# Patient Record
Sex: Female | Born: 1955
Health system: Southern US, Community
[De-identification: ages and names within clinical notes are randomized; demographics above are authoritative.]

## PROBLEM LIST (undated history)

## (undated) DIAGNOSIS — R079 Chest pain, unspecified: Secondary | ICD-10-CM

## (undated) DIAGNOSIS — F329 Major depressive disorder, single episode, unspecified: Secondary | ICD-10-CM

## (undated) DIAGNOSIS — G4700 Insomnia, unspecified: Secondary | ICD-10-CM

## (undated) DIAGNOSIS — G930 Cerebral cysts: Secondary | ICD-10-CM

## (undated) DIAGNOSIS — R9431 Abnormal electrocardiogram [ECG] [EKG]: Secondary | ICD-10-CM

## (undated) DIAGNOSIS — E785 Hyperlipidemia, unspecified: Secondary | ICD-10-CM

## (undated) DIAGNOSIS — F32A Depression, unspecified: Secondary | ICD-10-CM

## (undated) HISTORY — DX: Abnormal electrocardiogram (ECG) (EKG): R94.31

## (undated) HISTORY — DX: Major depressive disorder, single episode, unspecified: F32.9

## (undated) HISTORY — PX: COLONOSCOPY: SHX174

## (undated) HISTORY — PX: CHOLECYSTECTOMY: SHX55

## (undated) HISTORY — DX: Hyperlipidemia, unspecified: E78.5

## (undated) HISTORY — PX: BRAIN SURGERY: SHX531

## (undated) HISTORY — DX: Depression, unspecified: F32.A

## (undated) HISTORY — DX: Chest pain, unspecified: R07.9

## (undated) HISTORY — DX: Insomnia, unspecified: G47.00

## (undated) HISTORY — DX: Cerebral cysts: G93.0

---

## 1998-10-15 ENCOUNTER — Other Ambulatory Visit: Admission: RE | Admit: 1998-10-15 | Discharge: 1998-10-15 | Payer: Self-pay | Admitting: Obstetrics and Gynecology

## 2000-01-27 ENCOUNTER — Encounter: Payer: Self-pay | Admitting: Neurological Surgery

## 2000-01-27 ENCOUNTER — Inpatient Hospital Stay (HOSPITAL_COMMUNITY): Admission: AD | Admit: 2000-01-27 | Discharge: 2000-02-04 | Payer: Self-pay | Admitting: Neurological Surgery

## 2000-01-31 ENCOUNTER — Encounter: Payer: Self-pay | Admitting: Neurological Surgery

## 2000-02-03 ENCOUNTER — Encounter: Payer: Self-pay | Admitting: Neurological Surgery

## 2000-04-05 ENCOUNTER — Encounter: Payer: Self-pay | Admitting: Neurological Surgery

## 2000-04-05 ENCOUNTER — Ambulatory Visit: Admission: RE | Admit: 2000-04-05 | Discharge: 2000-04-05 | Payer: Self-pay | Admitting: Neurological Surgery

## 2001-01-07 ENCOUNTER — Ambulatory Visit (HOSPITAL_COMMUNITY): Admission: RE | Admit: 2001-01-07 | Discharge: 2001-01-07 | Payer: Self-pay | Admitting: Neurological Surgery

## 2001-01-07 ENCOUNTER — Encounter: Payer: Self-pay | Admitting: Neurological Surgery

## 2001-04-14 ENCOUNTER — Other Ambulatory Visit: Admission: RE | Admit: 2001-04-14 | Discharge: 2001-04-14 | Payer: Self-pay | Admitting: Obstetrics and Gynecology

## 2002-04-27 ENCOUNTER — Other Ambulatory Visit: Admission: RE | Admit: 2002-04-27 | Discharge: 2002-04-27 | Payer: Self-pay | Admitting: Obstetrics and Gynecology

## 2003-05-01 ENCOUNTER — Other Ambulatory Visit: Admission: RE | Admit: 2003-05-01 | Discharge: 2003-05-01 | Payer: Self-pay | Admitting: Obstetrics and Gynecology

## 2004-05-08 ENCOUNTER — Other Ambulatory Visit: Admission: RE | Admit: 2004-05-08 | Discharge: 2004-05-08 | Payer: Self-pay | Admitting: Obstetrics and Gynecology

## 2005-05-20 ENCOUNTER — Other Ambulatory Visit: Admission: RE | Admit: 2005-05-20 | Discharge: 2005-05-20 | Payer: Self-pay | Admitting: Obstetrics and Gynecology

## 2006-07-06 ENCOUNTER — Ambulatory Visit: Payer: Self-pay | Admitting: Internal Medicine

## 2006-07-16 ENCOUNTER — Ambulatory Visit: Payer: Self-pay | Admitting: Internal Medicine

## 2011-10-20 ENCOUNTER — Other Ambulatory Visit: Payer: Self-pay | Admitting: Obstetrics and Gynecology

## 2013-03-28 ENCOUNTER — Other Ambulatory Visit: Payer: Self-pay | Admitting: Family Medicine

## 2013-03-28 ENCOUNTER — Ambulatory Visit
Admission: RE | Admit: 2013-03-28 | Discharge: 2013-03-28 | Disposition: A | Discharge status: Home / Self Care | Payer: BC Managed Care – PPO | Source: Ambulatory Visit | Attending: Family Medicine | Admitting: Family Medicine

## 2013-03-28 DIAGNOSIS — R0781 Pleurodynia: Secondary | ICD-10-CM

## 2013-11-09 ENCOUNTER — Other Ambulatory Visit: Payer: Self-pay | Admitting: Obstetrics and Gynecology

## 2014-11-16 ENCOUNTER — Other Ambulatory Visit: Payer: Self-pay | Admitting: Obstetrics and Gynecology

## 2014-11-19 LAB — CYTOLOGY - PAP

## 2016-03-09 ENCOUNTER — Encounter: Payer: Self-pay | Admitting: Internal Medicine

## 2016-06-29 ENCOUNTER — Encounter: Payer: Self-pay | Admitting: Gastroenterology

## 2016-07-22 DIAGNOSIS — F331 Major depressive disorder, recurrent, moderate: Secondary | ICD-10-CM | POA: Diagnosis not present

## 2016-07-22 DIAGNOSIS — F5104 Psychophysiologic insomnia: Secondary | ICD-10-CM | POA: Diagnosis not present

## 2016-10-19 ENCOUNTER — Encounter: Payer: Self-pay | Admitting: Gastroenterology

## 2016-10-21 DIAGNOSIS — Z803 Family history of malignant neoplasm of breast: Secondary | ICD-10-CM | POA: Diagnosis not present

## 2016-10-21 DIAGNOSIS — Z1231 Encounter for screening mammogram for malignant neoplasm of breast: Secondary | ICD-10-CM | POA: Diagnosis not present

## 2016-10-29 DIAGNOSIS — N6489 Other specified disorders of breast: Secondary | ICD-10-CM | POA: Diagnosis not present

## 2016-10-29 DIAGNOSIS — N6002 Solitary cyst of left breast: Secondary | ICD-10-CM | POA: Diagnosis not present

## 2016-11-04 ENCOUNTER — Other Ambulatory Visit: Payer: Self-pay | Admitting: Radiology

## 2016-11-04 DIAGNOSIS — D242 Benign neoplasm of left breast: Secondary | ICD-10-CM | POA: Diagnosis not present

## 2016-11-04 DIAGNOSIS — N6320 Unspecified lump in the left breast, unspecified quadrant: Secondary | ICD-10-CM | POA: Diagnosis not present

## 2016-11-17 ENCOUNTER — Ambulatory Visit (AMBULATORY_SURGERY_CENTER): Payer: Self-pay | Admitting: *Deleted

## 2016-11-17 VITALS — Ht 65.0 in | Wt 166.0 lb

## 2016-11-17 DIAGNOSIS — Z1211 Encounter for screening for malignant neoplasm of colon: Secondary | ICD-10-CM

## 2016-11-17 MED ORDER — NA SULFATE-K SULFATE-MG SULF 17.5-3.13-1.6 GM/177ML PO SOLN: 1.0000 | Freq: Once | ORAL | 0 refills | Status: AC

## 2016-11-17 NOTE — Progress Notes (Signed)
No egg or soy allergy. No anesthesia problems.  No home O2.  No diet meds.  

## 2016-12-01 ENCOUNTER — Ambulatory Visit (AMBULATORY_SURGERY_CENTER): Payer: BLUE CROSS/BLUE SHIELD | Admitting: Gastroenterology

## 2016-12-01 ENCOUNTER — Encounter: Payer: Self-pay | Admitting: Gastroenterology

## 2016-12-01 VITALS — BP 111/77 | HR 59 | Temp 97.5°F | Resp 12 | Ht 65.0 in | Wt 166.0 lb

## 2016-12-01 DIAGNOSIS — D122 Benign neoplasm of ascending colon: Secondary | ICD-10-CM | POA: Diagnosis not present

## 2016-12-01 DIAGNOSIS — K635 Polyp of colon: Secondary | ICD-10-CM | POA: Diagnosis not present

## 2016-12-01 DIAGNOSIS — Z1211 Encounter for screening for malignant neoplasm of colon: Secondary | ICD-10-CM

## 2016-12-01 DIAGNOSIS — Z1212 Encounter for screening for malignant neoplasm of rectum: Secondary | ICD-10-CM

## 2016-12-01 MED ORDER — SODIUM CHLORIDE 0.9 % IV SOLN: 500.0000 mL | INTRAVENOUS | Status: DC

## 2016-12-01 NOTE — Patient Instructions (Signed)
YOU HAD AN ENDOSCOPIC PROCEDURE TODAY AT THE [facility]:   Refer to the procedure report that was given to you for any specific questions about what was found during the examination.  If the procedure report does not answer your questions, please call your gastroenterologist to clarify.  If you requested that your care partner not be given the details of your procedure findings, then the procedure report has been included in a sealed envelope for you to review at your convenience later.  YOU SHOULD EXPECT: Some feelings of bloating in the abdomen. Passage of more gas than usual.  Walking can help get rid of the air that was put into your GI tract during the procedure and reduce the bloating. If you had a lower endoscopy (such as a colonoscopy or flexible sigmoidoscopy) you may notice spotting of blood in your stool or on the toilet paper. If you underwent a bowel prep for your procedure, you may not have a normal bowel movement for a few days.  Please Note:  You might notice some irritation and congestion in your nose or some drainage.  This is from the oxygen used during your procedure.  There is no need for concern and it should clear up in a day or so.  SYMPTOMS TO REPORT IMMEDIATELY:   Following lower endoscopy (colonoscopy or flexible sigmoidoscopy):  Excessive amounts of blood in the stool  Significant tenderness or worsening of abdominal pains  Swelling of the abdomen that is new, acute  Fever of 100F or higher   For urgent or emergent issues, a gastroenterologist can be reached at any hour by calling [phone].   DIET:  We do recommend a small meal at first, but then you may proceed to your regular diet.  Drink plenty of fluids but you should avoid alcoholic beverages for 24 hours.  ACTIVITY:  You should plan to take it easy for the rest of today and you should NOT DRIVE or use heavy machinery until tomorrow (because of the sedation medicines used during the test).     FOLLOW UP: Our staff will call the number listed on your records the next business day following your procedure to check on you and address any questions or concerns that you may have regarding the information given to you following your procedure. If we do not reach you, we will leave a message.  However, if you are feeling well and you are not experiencing any problems, there is no need to return our call.  We will assume that you have returned to your regular daily activities without incident.  If any biopsies were taken you will be contacted by phone or by letter within the next 1-3 weeks.  Please call us at [phone] if you have not heard about the biopsies in 3 weeks.    SIGNATURES/CONFIDENTIALITY: You and/or your care partner have signed paperwork which will be entered into your electronic medical record.  These signatures attest to the fact that that the information above on your After Visit Summary has been reviewed and is understood.  Full responsibility of the confidentiality of this discharge information lies with you and/or your care-partner.   No ibuprofen,naproxen,or other non-steroidal anti-inflammatory drugs for 2 weeks,resume remainder of medications. Information given on polyps. 

## 2016-12-01 NOTE — Op Note (Signed)
[facility] Patient Name: [name] Procedure Date: 12/01/2016 10:38 AM MRN: [id number] Endoscopist: Remo Lipps P. Armbruster MD, MD Age: 61 Referring MD:  Date of Birth: 09/11/1956 Gender: Female Account #: [id number] Procedure:                Colonoscopy Indications:              Screening for colorectal malignant neoplasm Medicines:                Monitored Anesthesia Care Procedure:                Pre-Anesthesia Assessment:                           - Prior to the procedure, a History and Physical                            was performed, and patient medications and                            allergies were reviewed. The patient's tolerance of                            previous anesthesia was also reviewed. The risks                            and benefits of the procedure and the sedation                            options and risks were discussed with the patient.                            All questions were answered, and informed consent                            was obtained. Prior Anticoagulants: The patient has                            taken no previous anticoagulant or antiplatelet                            agents. ASA Grade Assessment: II - A patient with                            mild systemic disease. After reviewing the risks                            and benefits, the patient was deemed in                            satisfactory condition to undergo the procedure.                           After obtaining informed consent, the colonoscope  was passed under direct vision. Throughout the                            procedure, the patient's blood pressure, pulse, and                            oxygen saturations were monitored continuously. The                            Model CF-HQ190L [id number]) scope was introduced                            through the anus and advanced to the the cecum,                            identified  by appendiceal orifice and ileocecal                            valve. The colonoscopy was performed without                            difficulty. The patient tolerated the procedure                            well. The quality of the bowel preparation was                            good. The ileocecal valve, appendiceal orifice, and                            rectum were photographed. Scope In: 10:48:52 AM Scope Out: 11:10:32 AM Scope Withdrawal Time: 0 hours 18 minutes 7 seconds  Total Procedure Duration: 0 hours 21 minutes 40 seconds  Findings:                 The perianal and digital rectal examinations were                            normal.                           A 5 mm polyp was found in the ascending colon. The                            polyp was sessile. The polyp was removed with a                            cold snare. Resection and retrieval were complete.                           The colon was quite spastic in the right colon                            which prolonged the procedure. The exam was  otherwise without abnormality on direct and                            retroflexion views. Complications:            No immediate complications. Estimated blood loss:                            Minimal. Estimated Blood Loss:     Estimated blood loss was minimal. Impression:               - One 5 mm polyp in the ascending colon, removed                            with a cold snare. Resected and retrieved.                           - Spastic colon                           - The examination was otherwise normal on direct                            and retroflexion views. Recommendation:           - Patient has a contact number available for                            emergencies. The signs and symptoms of potential                            delayed complications were discussed with the                            patient. Return to normal activities  tomorrow.                            Written discharge instructions were provided to the                            patient.                           - Resume previous diet.                           - Continue present medications.                           - Await pathology results.                           - Repeat colonoscopy is recommended for                            surveillance. The colonoscopy date will be  determined after pathology results from today's                            exam become available for review.                           - No ibuprofen, naproxen, or other non-steroidal                            anti-inflammatory drugs for 2 weeks after polyp                            removal. Remo Lipps P. Armbruster MD, MD 12/01/2016 11:13:29 AM This report has been signed electronically.

## 2016-12-01 NOTE — Progress Notes (Signed)
  [facility] Post-op Note  Patient: [name]  Procedure(s) Performed: colonoscopy  Patient Location: [facility] - Recovery Area  Anesthesia Type: Deep Sedation/Propofol  Level of Consciousness: awake, oriented and patient cooperative  Airway and Oxygen Therapy: Patient Spontanous Breathing  Post-op Pain: none  Post-op Assessment:  Post-op Vital signs reviewed, Patient's Cardiovascular Status Stable, Respiratory Function Stable, Patent Airway, No signs of Nausea or vomiting and Pain level controlled  Post-op Vital Signs: Reviewed and stable  Complications: No apparent anesthesia complications  [name] E 11:17 AM

## 2016-12-01 NOTE — Progress Notes (Signed)
Called to room to assist during endoscopic procedure.  Patient ID and intended procedure confirmed with present staff. Received instructions for my participation in the procedure from the performing physician.  

## 2016-12-02 ENCOUNTER — Telehealth: Payer: Self-pay | Admitting: *Deleted

## 2016-12-02 ENCOUNTER — Telehealth: Payer: Self-pay

## 2016-12-02 NOTE — Telephone Encounter (Signed)
No answer or voicemail available will attempt to call back this afternoon. Sm

## 2016-12-02 NOTE — Telephone Encounter (Signed)
  Follow up Call-  Call back number 12/01/2016  Post procedure Call Back phone  # [phone]  Permission to leave phone message Yes  Some recent data might be hidden     Patient was called for follow up after his procedure on 12/01/2016. No answer at the number given for follow up phone call. I was not able to leave a message.

## 2016-12-07 ENCOUNTER — Encounter: Payer: Self-pay | Admitting: Gastroenterology

## 2016-12-21 ENCOUNTER — Other Ambulatory Visit (HOSPITAL_COMMUNITY)
Admission: RE | Admit: 2016-12-21 | Discharge: 2016-12-21 | Disposition: A | Discharge status: Home / Self Care | Payer: BLUE CROSS/BLUE SHIELD | Source: Ambulatory Visit | Attending: Family Medicine | Admitting: Family Medicine

## 2016-12-21 ENCOUNTER — Other Ambulatory Visit: Payer: Self-pay | Admitting: Family Medicine

## 2016-12-21 DIAGNOSIS — Z01411 Encounter for gynecological examination (general) (routine) with abnormal findings: Secondary | ICD-10-CM | POA: Diagnosis not present

## 2016-12-21 DIAGNOSIS — Z13220 Encounter for screening for lipoid disorders: Secondary | ICD-10-CM | POA: Diagnosis not present

## 2016-12-21 DIAGNOSIS — Z1151 Encounter for screening for human papillomavirus (HPV): Secondary | ICD-10-CM | POA: Insufficient documentation

## 2016-12-21 DIAGNOSIS — Z01419 Encounter for gynecological examination (general) (routine) without abnormal findings: Secondary | ICD-10-CM | POA: Diagnosis not present

## 2016-12-21 DIAGNOSIS — Z0000 Encounter for general adult medical examination without abnormal findings: Secondary | ICD-10-CM | POA: Diagnosis not present

## 2016-12-21 DIAGNOSIS — Z5181 Encounter for therapeutic drug level monitoring: Secondary | ICD-10-CM | POA: Diagnosis not present

## 2016-12-24 LAB — CYTOLOGY - PAP
Diagnosis: NEGATIVE
HPV: NOT DETECTED

## 2017-01-22 ENCOUNTER — Ambulatory Visit (INDEPENDENT_AMBULATORY_CARE_PROVIDER_SITE_OTHER): Payer: Self-pay | Admitting: Orthopedic Surgery

## 2018-09-19 DIAGNOSIS — Z13220 Encounter for screening for lipoid disorders: Secondary | ICD-10-CM | POA: Diagnosis not present

## 2018-09-19 DIAGNOSIS — Z23 Encounter for immunization: Secondary | ICD-10-CM | POA: Diagnosis not present

## 2018-09-19 DIAGNOSIS — Z5181 Encounter for therapeutic drug level monitoring: Secondary | ICD-10-CM | POA: Diagnosis not present

## 2018-09-19 DIAGNOSIS — Z0000 Encounter for general adult medical examination without abnormal findings: Secondary | ICD-10-CM | POA: Diagnosis not present

## 2018-10-24 DIAGNOSIS — F331 Major depressive disorder, recurrent, moderate: Secondary | ICD-10-CM | POA: Diagnosis not present

## 2020-09-14 ENCOUNTER — Ambulatory Visit: Payer: BLUE CROSS/BLUE SHIELD | Attending: Internal Medicine

## 2020-09-14 DIAGNOSIS — Z23 Encounter for immunization: Secondary | ICD-10-CM

## 2020-09-14 NOTE — Progress Notes (Signed)
   Covid-19 Vaccination Clinic  Name:  SHANDORA KOOGLER    MRN: [id number] DOB: 29-Jun-1956  09/14/2020  Ms. Deardorff was observed post Covid-19 immunization for 15 minutes without incident. She was provided with Vaccine Information Sheet and instruction to access the V-Safe system.   Ms. Quest was instructed to call 911 with any severe reactions post vaccine: Marland Kitchen Difficulty breathing  . Swelling of face and throat  . A fast heartbeat  . A bad rash all over body  . Dizziness and weakness

## 2020-10-29 ENCOUNTER — Other Ambulatory Visit: Payer: Self-pay | Admitting: Family Medicine

## 2020-10-29 DIAGNOSIS — Z1231 Encounter for screening mammogram for malignant neoplasm of breast: Secondary | ICD-10-CM

## 2020-11-08 ENCOUNTER — Encounter: Payer: Self-pay | Admitting: General Practice

## 2020-12-03 ENCOUNTER — Other Ambulatory Visit: Payer: Self-pay

## 2020-12-03 ENCOUNTER — Encounter: Payer: Self-pay | Admitting: Internal Medicine

## 2020-12-03 ENCOUNTER — Ambulatory Visit (INDEPENDENT_AMBULATORY_CARE_PROVIDER_SITE_OTHER): Payer: Medicare Other | Admitting: Internal Medicine

## 2020-12-03 VITALS — BP 114/70 | HR 84 | Ht 65.0 in | Wt 214.0 lb

## 2020-12-03 DIAGNOSIS — E782 Mixed hyperlipidemia: Secondary | ICD-10-CM | POA: Diagnosis not present

## 2020-12-03 DIAGNOSIS — R079 Chest pain, unspecified: Secondary | ICD-10-CM

## 2020-12-03 NOTE — Progress Notes (Signed)
Cardiology Office Note:    Date:  12/03/2020   ID:  [name], DOB 1956/04/28, MRN [id number]  PCP:  Shaune Pollack, MD (Inactive)  [facility] Cardiologist:  No primary care provider on file.  [facility] Electrophysiologist:  None   CC: EKG changes Consulted for the evaluation of chest pain at the behest of Shaune Pollack, MD (Inactive)  History of Present Illness:    [name] is a 65 y.o. female with a hx of Morbid Obesity with HLD, with chest pain presenting for evaluation.  Patient notes that she is feeling tired a lot.  Notse that she has sharp, sternal pain.; this happened one month ago,  Discomfort occured with sitting in her recline, worsens spontaneous, and improves within one second.  Notes having brief nauseous spell working in her [employer] 2 weeks prior.  Patient exertion notable for walking (1.5 miles) and feels no symptoms.  No shortness of breath, DOE.  No PND or orthopnea.  No bendopnea, leg swelling , or abdominal swelling.  No syncope or near syncope.  Notes no palpitations or funny heart beats.     Patient reports NO prior cardiac testing including  echo,  stress test,  heart catheterizations.  No history of pre-eclampsia.  No Fen-Phen.  Past Medical History:  Diagnosis Date  . Abnormal EKG   . Brain cyst   . Chest pain   . Depression   . Insomnia     Past Surgical History:  Procedure Laterality Date  . BRAIN SURGERY     removed cyst  . CHOLECYSTECTOMY    . COLONOSCOPY      Current Medications: Current Meds  Medication Sig  . Lactobacillus Rhamnosus, GG, (CULTURELLE PO) Take by mouth.  . Multiple Vitamins-Minerals (MULTIVITAMIN ADULT PO) Take by mouth.  Marland Kitchen PARoxetine (PAXIL) 30 MG tablet Take 30 mg by mouth daily.  Marland Kitchen zolpidem (AMBIEN) 10 MG tablet Take 10 mg by mouth at bedtime as needed for sleep.   Current Facility-Administered Medications for the 12/03/20 encounter (Office Visit) with Christell Constant, MD  Medication   . 0.9 %  sodium chloride infusion     Allergies:   Suvorexant   Social History   Socioeconomic History  . Marital status: Married    Spouse name: Not on file  . Number of children: Not on file  . Years of education: Not on file  . Highest education level: Not on file  Occupational History  . Not on file  Tobacco Use  . Smoking status: Former Smoker    Types: Cigarettes    Quit date: 02/16/2004    Years since quitting: 16.8  . Smokeless tobacco: Never Used  Substance and Sexual Activity  . Alcohol use: No  . Drug use: No  . Sexual activity: Not on file  Other Topics Concern  . Not on file  Social History Narrative  . 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 There is no history of Colon cancer, Esophageal cancer, Rectal cancer, or Stomach cancer. History of coronary artery disease notable for father, possibly in brother. History of heart failure notable for father. History of arrhythmia notable for no members, but her brother has to take a blood thinner NOS.  ROS:   Please see the history of present illness.    All other systems reviewed and are negative.  EKGs/Labs/Other Studies Reviewed:    The following studies were reviewed today:  EKG:   1/4.22: SR rate 84 low voltage QRS with borderline evidence of anterior infarct pattern  Recent Labs: No results found for requested  labs within last 8760 hours.  Recent Lipid Panel No results found for: CHOL, TRIG, HDL, CHOLHDL, VLDL, LDLCALC, LDLDIRECT  [facility] Labs 10/23/20 Cholesterol 192 HDL 51 LDL 113 TGs 159  Risk Assessment/Calculations:     ASCVD Risk 6.6%  Physical Exam:    VS:  BP 114/70   Pulse 84   Ht 5\' 5"  (1.651 m)   Wt 214 lb (97.1 kg)   SpO2 96%   BMI 35.61 kg/m     Wt Readings from Last 3 Encounters:  12/03/20 214 lb (97.1 kg)  12/01/16 166 lb (75.3 kg)  11/17/16 166 lb (75.3 kg)    GEN: Obese. well developed in no acute distress HEENT: Normal NECK: No JVD; No carotid bruits LYMPHATICS: No lymphadenopathy CARDIAC: RRR, no murmurs, rubs, gallops RESPIRATORY:  Clear to auscultation without rales, wheezing or rhonchi  ABDOMEN: Soft, non-tender, non-distended MUSCULOSKELETAL:  No edema; No deformity  SKIN: Warm and dry NEUROLOGIC:  Alert and oriented x 3 PSYCHIATRIC:  Normal affect   ASSESSMENT:    1. Chest pain of uncertain etiology   2. Mixed hyperlipidemia   3. Morbid obesity (HCC)    PLAN:    In order of problems listed above:  Chest Pain HLD Morbid Obesity - The patient presents with non-cardiac  - ASCVD risk estimated at 6.6 % - will get CAC score to risk stratify  - if new cardiac sx emerge, low threshold to pursue ischemic work up  3 months follow up unless new symptoms or abnormal test results warranting change in plan  Would be reasonable for  Virtual Follow up  Would be reasonable for  APP Follow up       Medication Adjustments/Labs and Tests Ordered: Current medicines are reviewed at length with the patient today.  Concerns regarding medicines are outlined above.  Orders Placed This Encounter  Procedures  . CT CARDIAC SCORING (SELF PAY ONLY)  . EKG 12-Lead   No orders of the defined types were placed in this encounter.   Patient Instructions  Medication Instructions:  Your physician recommends that you continue on your current medications as  directed. Please refer to the Current Medication list given to you today.  *If you need a refill on your cardiac medications before your next appointment, please call your pharmacy*  Lab Work: If you have labs (blood work) drawn today and your tests are completely normal, you will receive your results only by: Marland Kitchen MyChart Message (if you have MyChart) OR . A paper copy in the mail If you have any lab test that is abnormal or we need to change your treatment, we will call you to review the results.  Testing/Procedures: Cardiac CT scanning for calcium score, (CAT scanning), is a noninvasive, special x-ray that produces cross-sectional  images of the body using x-rays and a computer. CT scans help physicians diagnose and treat medical conditions. For some CT exams, a contrast material is used to enhance visibility in the area of the body being studied. CT scans provide greater clarity and reveal more details than regular x-ray exams.  Follow-Up: At [facility], you and your health needs are our priority.  As part of our continuing mission to provide you with exceptional heart care, we have created designated Provider Care Teams.  These Care Teams include your primary Cardiologist (physician) and Advanced Practice Providers (APPs -  Physician Assistants and Nurse Practitioners) who all work together to provide you with the care you need, when you need it.  We recommend signing up for the patient portal called "MyChart".  Sign up information is provided on this After Visit Summary.  MyChart is used to connect with patients for Virtual Visits (Telemedicine).  Patients are able to view lab/test results, encounter notes, upcoming appointments, etc.  Non-urgent messages can be sent to your provider as well.   To learn more about what you can do with MyChart, go to [email or web address].    Your next appointment:   3 month(s)  The format for your next appointment:   In Person  Provider:   You may  see Dr. Gasper Sells or one of the following Advanced Practice Providers on your designated Care Team:    Melina Copa, PA-C  Ermalinda Barrios, PA-C        Signed, Werner Lean, MD  12/03/2020 9:27 AM    [place]

## 2020-12-03 NOTE — Patient Instructions (Signed)
Medication Instructions:  Your physician recommends that you continue on your current medications as directed. Please refer to the Current Medication list given to you today.  *If you need a refill on your cardiac medications before your next appointment, please call your pharmacy*  Lab Work: If you have labs (blood work) drawn today and your tests are completely normal, you will receive your results only by: Marland Kitchen MyChart Message (if you have MyChart) OR . A paper copy in the mail If you have any lab test that is abnormal or we need to change your treatment, we will call you to review the results.  Testing/Procedures: Cardiac CT scanning for calcium score, (CAT scanning), is a noninvasive, special x-ray that produces cross-sectional images of the body using x-rays and a computer. CT scans help physicians diagnose and treat medical conditions. For some CT exams, a contrast material is used to enhance visibility in the area of the body being studied. CT scans provide greater clarity and reveal more details than regular x-ray exams.  Follow-Up: At [facility], you and your health needs are our priority.  As part of our continuing mission to provide you with exceptional heart care, we have created designated Provider Care Teams.  These Care Teams include your primary Cardiologist (physician) and Advanced Practice Providers (APPs -  Physician Assistants and Nurse Practitioners) who all work together to provide you with the care you need, when you need it.  We recommend signing up for the patient portal called "MyChart".  Sign up information is provided on this After Visit Summary.  MyChart is used to connect with patients for Virtual Visits (Telemedicine).  Patients are able to view lab/test results, encounter notes, upcoming appointments, etc.  Non-urgent messages can be sent to your provider as well.   To learn more about what you can do with MyChart, go to [email or web address].    Your next  appointment:   3 month(s)  The format for your next appointment:   In Person  Provider:   You may see Dr. Izora Ribas or one of the following Advanced Practice Providers on your designated Care Team:    Ronie Spies, PA-C  Jacolyn Reedy, PA-C

## 2020-12-10 ENCOUNTER — Ambulatory Visit (INDEPENDENT_AMBULATORY_CARE_PROVIDER_SITE_OTHER)
Admission: RE | Admit: 2020-12-10 | Discharge: 2020-12-10 | Disposition: A | Discharge status: Home / Self Care | Payer: Self-pay | Source: Ambulatory Visit | Attending: Internal Medicine | Admitting: Internal Medicine

## 2020-12-10 ENCOUNTER — Other Ambulatory Visit: Payer: Self-pay

## 2020-12-10 DIAGNOSIS — E782 Mixed hyperlipidemia: Secondary | ICD-10-CM

## 2020-12-10 DIAGNOSIS — R079 Chest pain, unspecified: Secondary | ICD-10-CM

## 2020-12-11 ENCOUNTER — Telehealth: Payer: Self-pay

## 2020-12-11 ENCOUNTER — Ambulatory Visit: Payer: BLUE CROSS/BLUE SHIELD

## 2020-12-11 DIAGNOSIS — R911 Solitary pulmonary nodule: Secondary | ICD-10-CM

## 2020-12-11 NOTE — Telephone Encounter (Signed)
Left patient a message regarding recent results, requested a call back to the office. Results sent to PCP.

## 2020-12-11 NOTE — Telephone Encounter (Signed)
The patient has been notified of the result and verbalized understanding.  All questions (if any) were answered.  Order for Echo placed.  Sent to Dr. Justin Mend, PCP  Wilma Flavin, RN 12/11/2020 2:42 PM

## 2020-12-11 NOTE — Telephone Encounter (Signed)
-----   Message from Werner Lean, MD sent at 12/10/2020  4:59 PM EST ----- Results: No evidence of coronary calcium Borderline dilation of the ascending aorta Incidental pulmonary nodule Plan: Confirm with echocardiogram Please send results to PCP who may either follow her pulmonary nodules or refer to solitary nodule clinic through [facility]  Werner Lean, MD

## 2020-12-11 NOTE — Telephone Encounter (Signed)
-----   Message from Mahesh A Chandrasekhar, MD sent at 12/10/2020  4:59 PM EST ----- Results: No evidence of coronary calcium Borderline dilation of the ascending aorta Incidental pulmonary nodule Plan: Confirm with echocardiogram Please send results to PCP who may either follow her pulmonary nodules or refer to solitary nodule clinic through [facility]  Mahesh A Chandrasekhar, MD  

## 2020-12-11 NOTE — Telephone Encounter (Signed)
Patient is returning call to review CT results.

## 2021-01-02 ENCOUNTER — Ambulatory Visit (HOSPITAL_COMMUNITY): Payer: Medicare Other | Attending: Cardiology

## 2021-01-02 ENCOUNTER — Other Ambulatory Visit: Payer: Self-pay

## 2021-01-02 DIAGNOSIS — E669 Obesity, unspecified: Secondary | ICD-10-CM | POA: Insufficient documentation

## 2021-01-02 DIAGNOSIS — G4700 Insomnia, unspecified: Secondary | ICD-10-CM | POA: Insufficient documentation

## 2021-01-02 DIAGNOSIS — R9431 Abnormal electrocardiogram [ECG] [EKG]: Secondary | ICD-10-CM | POA: Diagnosis not present

## 2021-01-02 DIAGNOSIS — R911 Solitary pulmonary nodule: Secondary | ICD-10-CM | POA: Diagnosis not present

## 2021-01-02 DIAGNOSIS — E785 Hyperlipidemia, unspecified: Secondary | ICD-10-CM | POA: Diagnosis not present

## 2021-01-02 DIAGNOSIS — R079 Chest pain, unspecified: Secondary | ICD-10-CM | POA: Diagnosis not present

## 2021-01-02 LAB — ECHOCARDIOGRAM COMPLETE
Area-P 1/2: 4.21 cm2
S' Lateral: 2.8 cm

## 2021-01-03 ENCOUNTER — Telehealth: Payer: Self-pay | Admitting: Internal Medicine

## 2021-01-03 NOTE — Telephone Encounter (Signed)
Patient returning call for echo results. 

## 2021-01-03 NOTE — Telephone Encounter (Signed)
Pt aware of echo results ./cy 

## 2021-01-22 ENCOUNTER — Other Ambulatory Visit: Payer: Self-pay

## 2021-01-22 ENCOUNTER — Ambulatory Visit
Admission: RE | Admit: 2021-01-22 | Discharge: 2021-01-22 | Disposition: A | Discharge status: Home / Self Care | Payer: Medicare Other | Source: Ambulatory Visit | Attending: Family Medicine | Admitting: Family Medicine

## 2021-01-22 DIAGNOSIS — Z1231 Encounter for screening mammogram for malignant neoplasm of breast: Secondary | ICD-10-CM | POA: Diagnosis not present

## 2021-03-06 ENCOUNTER — Other Ambulatory Visit: Payer: Self-pay

## 2021-03-06 ENCOUNTER — Ambulatory Visit: Payer: Medicare Other | Admitting: Internal Medicine

## 2021-03-06 ENCOUNTER — Encounter: Payer: Self-pay | Admitting: Internal Medicine

## 2021-03-06 DIAGNOSIS — Z87891 Personal history of nicotine dependence: Secondary | ICD-10-CM | POA: Diagnosis not present

## 2021-03-06 DIAGNOSIS — E782 Mixed hyperlipidemia: Secondary | ICD-10-CM | POA: Diagnosis not present

## 2021-03-06 DIAGNOSIS — R911 Solitary pulmonary nodule: Secondary | ICD-10-CM | POA: Diagnosis not present

## 2021-03-06 DIAGNOSIS — I700 Atherosclerosis of aorta: Secondary | ICD-10-CM | POA: Insufficient documentation

## 2021-03-06 NOTE — Patient Instructions (Signed)
Medication Instructions:  Your physician recommends that you continue on your current medications as directed. Please refer to the Current Medication list given to you today.  *If you need a refill on your cardiac medications before your next appointment, please call your pharmacy*   Lab Work: NONE If you have labs (blood work) drawn today and your tests are completely normal, you will receive your results only by: Marland Kitchen MyChart Message (if you have MyChart) OR . A paper copy in the mail If you have any lab test that is abnormal or we need to change your treatment, we will call you to review the results.   Testing/Procedures: NONE   Follow-Up: AS NEEDED At [facility], you and your health needs are our priority.  As part of our continuing mission to provide you with exceptional heart care, we have created designated Provider Care Teams.  These Care Teams include your primary Cardiologist (physician) and Advanced Practice Providers (APPs -  Physician Assistants and Nurse Practitioners) who all work together to provide you with the care you need, when you need it.  We recommend signing up for the patient portal called "MyChart".  Sign up information is provided on this After Visit Summary.  MyChart is used to connect with patients for Virtual Visits (Telemedicine).  Patients are able to view lab/test results, encounter notes, upcoming appointments, etc.  Non-urgent messages can be sent to your provider as well.   To learn more about what you can do with MyChart, go to [email or web address].

## 2021-03-06 NOTE — Progress Notes (Signed)
Cardiology Office Note:    Date:  03/06/2021   ID:  ZANYIAH Pratt, DOB 07-02-1956, MRN [id number]  PCP:  Maurice Small, MD  [facility] Cardiologist:  Rudean Haskell MD [place] Electrophysiologist:  None   CC: CAC follow up  History of Present Illness:    [name] is a 65 y.o. female with a hx of Morbid Obesity with HLD, with chest pain presenting for evaluation 12/03/20.  In interim of this visit, patient normal CAC.  Patient notes that she is doing good.  Since last visit notes no changes.  Relevant interval testing or therapy include CAC and Echo.  There are no interval hospital/ED visit.    No chest pain or pressure since last visit.  No SOB/DOE and no PND/Orthopnea.  No weight gain or leg swelling.  No palpitations or syncope.  Past Medical History:  Diagnosis Date  . Abnormal EKG   . Brain cyst   . Chest pain   . Depression   . Insomnia     Past Surgical History:  Procedure Laterality Date  . BRAIN SURGERY     removed cyst  . CHOLECYSTECTOMY    . COLONOSCOPY      Current Medications: Current Meds  Medication Sig  . Lactobacillus Rhamnosus, GG, (CULTURELLE PO) Take by mouth daily.  . Multiple Vitamins-Minerals (MULTIVITAMIN ADULT PO) Take by mouth daily.  Marland Kitchen PARoxetine (PAXIL) 30 MG tablet Take 30 mg by mouth daily.  Marland Kitchen zolpidem (AMBIEN) 10 MG tablet Take 10 mg by mouth at bedtime as needed for sleep.   Current Facility-Administered Medications for the 03/06/21 encounter (Office Visit) with Werner Lean, MD  Medication  . 0.9 %  sodium chloride infusion     Allergies:   Suvorexant   Social History   Socioeconomic History  . Marital status: Married    Spouse name: Not on file  . Number of children: Not on file  . Years of education: Not on file  . Highest education level: Not on file  Occupational History  . Not on file  Tobacco Use  . Smoking status: Former Smoker    Types: Cigarettes    Quit date: 02/16/2004    Years  since quitting: 17.0  . Smokeless tobacco: Never Used  Substance and Sexual Activity  . Alcohol use: No  . Drug use: No  . Sexual activity: Not on file  Other Topics Concern  . Not on file  Social History Narrative  . 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 There is no history of Colon cancer, Esophageal cancer, Rectal cancer, or Stomach cancer. History of coronary artery disease notable for father, possibly in brother. History of heart failure notable for father. History of arrhythmia notable for no members, but her brother has to take a blood thinner NOS.  ROS:   Please see the  history of present illness.    All other systems reviewed and are negative.  EKGs/Labs/Other Studies Reviewed:    The following studies were reviewed today:  EKG:   1/4.22: SR rate 84 low voltage QRS with borderline evidence of anterior infarct pattern  Transthoracic Echocardiogram: Date: 01/02/21 Results: 1. Left ventricular ejection fraction, by estimation, is 60 to 65%. The  left ventricle has normal function. The left ventricle has no regional  wall motion abnormalities. Left ventricular diastolic parameters were  normal.  2. Right ventricular systolic function is normal. The right ventricular  size is normal. There is normal pulmonary artery systolic pressure.  3. The mitral valve is normal in  structure. Trivial mitral valve  regurgitation.  4. The aortic valve is grossly normal. There is mild calcification of the  aortic valve. There is mild thickening of the aortic valve. Aortic valve  regurgitation is not visualized.  5. The inferior vena cava is normal in size with greater than 50%  respiratory variability, suggesting right atrial pressure of 3 mmHg.   Comparison(s): No prior Echocardiogram.   Conclusion(s)/Recommendation(s): Otherwise normal echocardiogram, with  minor abnormalities described in the report.   CAC Scan: Date: 12/10/20 Results: IMPRESSION:  IMPRESSION: 1. Coronary calcium score of 0. This was 1st percentile for age, gender, and race matched controls.  2.  Borderline ascending aortic dilation.  No acute process in the imaged chest.  Right-sided pulmonary nodules of maximally 4 mm. No follow-up needed if patient is low-risk. Non-contrast chest CT can be considered in 12 months if patient is high-risk. This recommendation follows the consensus statement: Guidelines for Management of Incidental Pulmonary Nodules Detected on CT Images: From the Fleischner Society 2017; Radiology 2017; 284:228-243.   Recent Labs: No results found for requested labs within last 8760 hours.  Recent Lipid Panel No results found for: CHOL, TRIG, HDL, CHOLHDL, VLDL, LDLCALC, LDLDIRECT  OSH Labs 10/23/20 Cholesterol 192 HDL 51 LDL 113 TGs 159  Risk Assessment/Calculations:     ASCVD Risk 6.6%  Physical Exam:    VS:  BP 120/70   Pulse 84   Ht 5\' 5"  (1.651 m)   Wt 211 lb (95.7 kg)   SpO2 96%   BMI 35.11 kg/m     Wt Readings from Last 3 Encounters:  03/06/21 211 lb (95.7 kg)  12/03/20 214 lb (97.1 kg)  12/01/16 166 lb (75.3 kg)    GEN: Obese. well developed in no acute distress HEENT: Normal NECK: No JVD; No carotid bruits LYMPHATICS: No lymphadenopathy CARDIAC: RRR, no murmurs, rubs, gallops RESPIRATORY:  Clear to auscultation without rales,  wheezing or rhonchi  ABDOMEN: Soft, non-tender, non-distended MUSCULOSKELETAL:  No edema; No deformity  SKIN: Warm and dry NEUROLOGIC:  Alert and oriented x 3 PSYCHIATRIC:  Normal affect   ASSESSMENT:    1. Morbid obesity ([place])   2. Mixed hyperlipidemia   3. Aortic atherosclerosis ([facility])   4. Pulmonary nodule   5. Former smoker    PLAN:    In order of problems listed above:  Morbid Obesity Aortic Atherosclerosis HLD - ASCVD risk estimated at 6.6 % - Patient will try diet and exercise and will reconsider in the future (we offered medication for a LDL goal < 70)  Pulmonary Nodule 17mm Former Smoker - Patient would like to see PCP in follow up; can see [facility] if patient changes her mind - needs non con CT scan in one year  PRN follow up unless new symptoms or abnormal test results  warranting change in plan  Would be reasonable for  APP Follow up    Medication Adjustments/Labs and Tests Ordered: Current medicines are reviewed at length with the patient today.  Concerns regarding medicines are outlined above.  No orders of the defined types were placed in this encounter.  No orders of the defined types were placed in this encounter.   Patient Instructions  Medication Instructions:  Your physician recommends that you continue on your current medications as directed. Please refer to the Current Medication list given to you today.  *If you need a refill on your cardiac medications before your next appointment, please call your pharmacy*   Lab Work: NONE If you have labs (blood work) drawn today and your tests are completely normal, you will receive your results only by: Marland Kitchen MyChart Message (if you have MyChart) OR . A paper copy in the mail If you have any lab test that is abnormal or we need to change your treatment, we will call you to review the results.   Testing/Procedures: NONE   Follow-Up: AS NEEDED At [facility], you and your health needs are our  priority.  As part of our continuing mission to provide you with exceptional heart care, we have created designated Provider Care Teams.  These Care Teams include your primary Cardiologist (physician) and Advanced Practice Providers (APPs -  Physician Assistants and Nurse Practitioners) who all work together to provide you with the care you need, when you need it.  We recommend signing up for the patient portal called "MyChart".  Sign up information is provided on this After Visit Summary.  MyChart is used to connect with patients for Virtual Visits (Telemedicine).  Patients are able to view lab/test results, encounter notes, upcoming appointments, etc.  Non-urgent messages can be sent to your provider as well.   To learn more about what you can do with MyChart, go to [email or web address].        Signed, Werner Lean, MD  03/06/2021 3:55 PM    [facility]

## 2021-10-27 DIAGNOSIS — Z23 Encounter for immunization: Secondary | ICD-10-CM | POA: Diagnosis not present

## 2021-11-04 DIAGNOSIS — Z0000 Encounter for general adult medical examination without abnormal findings: Secondary | ICD-10-CM | POA: Diagnosis not present

## 2021-11-04 DIAGNOSIS — E785 Hyperlipidemia, unspecified: Secondary | ICD-10-CM | POA: Diagnosis not present

## 2021-11-04 DIAGNOSIS — Z5181 Encounter for therapeutic drug level monitoring: Secondary | ICD-10-CM | POA: Diagnosis not present

## 2021-11-04 DIAGNOSIS — Z136 Encounter for screening for cardiovascular disorders: Secondary | ICD-10-CM | POA: Diagnosis not present

## 2021-11-04 DIAGNOSIS — Z23 Encounter for immunization: Secondary | ICD-10-CM | POA: Diagnosis not present

## 2021-11-06 ENCOUNTER — Other Ambulatory Visit: Payer: Self-pay | Admitting: Physical Medicine and Rehabilitation

## 2021-11-06 DIAGNOSIS — Z1231 Encounter for screening mammogram for malignant neoplasm of breast: Secondary | ICD-10-CM

## 2021-12-10 DIAGNOSIS — E7800 Pure hypercholesterolemia, unspecified: Secondary | ICD-10-CM | POA: Diagnosis not present

## 2022-01-01 ENCOUNTER — Encounter: Payer: Self-pay | Admitting: Podiatrist

## 2022-01-01 ENCOUNTER — Ambulatory Visit (INDEPENDENT_AMBULATORY_CARE_PROVIDER_SITE_OTHER): Payer: Medicare Other

## 2022-01-01 ENCOUNTER — Ambulatory Visit: Payer: Medicare Other | Admitting: Podiatrist

## 2022-01-01 ENCOUNTER — Other Ambulatory Visit: Payer: Self-pay

## 2022-01-01 DIAGNOSIS — F5104 Psychophysiologic insomnia: Secondary | ICD-10-CM | POA: Insufficient documentation

## 2022-01-01 DIAGNOSIS — F331 Major depressive disorder, recurrent, moderate: Secondary | ICD-10-CM | POA: Insufficient documentation

## 2022-01-01 DIAGNOSIS — E7800 Pure hypercholesterolemia, unspecified: Secondary | ICD-10-CM | POA: Insufficient documentation

## 2022-01-01 DIAGNOSIS — M722 Plantar fascial fibromatosis: Secondary | ICD-10-CM | POA: Diagnosis not present

## 2022-01-01 MED ORDER — TRIAMCINOLONE ACETONIDE 10 MG/ML IJ SUSP
10.0000 mg | Freq: Once | INTRAMUSCULAR | Status: AC
  Administered 2022-01-01: 10 mg

## 2022-01-01 NOTE — Patient Instructions (Addendum)
Keep taking your prescription Ibuprofen for the next few days, then you may wean off this medication.  Try to get the [place] womens addiction walking --non slip shoes from [employer] (or [employer] probably has them as well)-  maybe order a couple sizes to figure out which size works best for you (triple check they will take returns)   Wear the brace while you are up and standing on your foot to support the plantar fascia.    Plantar Fasciitis (Heel Spur Syndrome) with Rehab The plantar fascia is a fibrous, ligament-like, soft-tissue structure that spans the bottom of the foot. Plantar fasciitis is a condition that causes pain in the foot due to inflammation of the tissue. SYMPTOMS  Pain and tenderness on the underneath side of the foot. Pain that worsens with standing or walking. CAUSES  Plantar fasciitis is caused by irritation and injury to the plantar fascia on the underneath side of the foot. Common mechanisms of injury include: Direct trauma to bottom of the foot. Damage to a small nerve that runs under the foot where the main fascia attaches to the heel bone. Stress placed on the plantar fascia due to any mild increased activity or injury RISK INCREASES WITH:  Obesity. Poor strength and flexibility. Improperly fitted shoes. Tight calf muscles. Flat feet. Failure to warm-up properly before activity.  PREVENTION Warm up and stretch properly before activity. Strength, flexibility Maintain a health body weight. Avoid stress on the plantar fascia. Wear properly fitted shoes, including arch supports for individuals who have flat feet. PROGNOSIS  If treated properly, then the symptoms of plantar fasciitis usually resolve without surgery. However, occasionally surgery is necessary. RELATED COMPLICATIONS  Recurrent symptoms that may result in a chronic condition. Problems of the lower back that are caused by compensating for the injury, such as limping. Pain or weakness of the foot during  push-off following surgery. Chronic inflammation, scarring, and partial or complete fascia tear, occurring more often from repeated injections. TREATMENT  Treatment initially involves the use of ice and medication to help reduce pain and inflammation. The use of strengthening and stretching exercises may help reduce pain with activity, especially stretches of the Achilles tendon.  Your caregiver may recommend that you use arch supports to help reduce stress on the plantar fascia. Often, corticosteroid injections are given to reduce inflammation. If symptoms persist for greater than 6 months despite non-surgical (conservative), then surgery may be recommended.  MEDICATION  If pain medication is necessary, then nonsteroidal anti-inflammatory medications, such as aspirin and ibuprofen, or other minor pain relievers, such as acetaminophen, are often recommended. Corticosteroid injections may be given by your caregiver.  HEAT AND COLD Cold treatment (icing) relieves pain and reduces inflammation. Cold treatment should be applied for 10 to 15 minutes every 2 to 3 hours for inflammation and pain and immediately after any activity that aggravates your symptoms. Use ice packs or massage the area with a piece of ice (ice massage). Heat treatment may be used prior to performing the stretching and strengthening activities prescribed by your caregiver, physical therapist, or athletic trainer. Use a heat pack or soak the injury in warm water. SEEK IMMEDIATE MEDICAL CARE IF: Treatment seems to offer no benefit, or the condition worsens. Any medications produce adverse side effects.  Perform this particular stretch daily first thing in the morning and before you go to bed. Hold for 30 seconds.    Try all the exercises and choose your favorite 3 to perform daily--  EXERCISES-- perform each  exercise a total of 10-15 repetitions.  Hold for 30 seconds and perform 3 times per day   RANGE OF MOTION (ROM) AND  STRETCHING EXERCISES - Plantar Fasciitis (Heel Spur Syndrome) These exercises may help you when beginning to rehabilitate your injury.   While completing these exercises, remember:  Restoring tissue flexibility helps normal motion to return to the joints. This allows healthier, less painful movement and activity. An effective stretch should be held for at least 30 seconds. A stretch should never be painful. You should only feel a gentle lengthening or release in the stretched tissue. RANGE OF MOTION - Toe Extension, Flexion Sit with your right / left leg crossed over your opposite knee. Grasp your toes and gently pull them back toward the top of your foot. You should feel a stretch on the bottom of your toes and/or foot. Hold this stretch for __________ seconds. Now, gently pull your toes toward the bottom of your foot. You should feel a stretch on the top of your toes and or foot. Hold this stretch for __________ seconds. Repeat __________ times. Complete this stretch __________ times per day.  RANGE OF MOTION - Ankle Dorsiflexion, Active Assisted Remove shoes and sit on a chair that is preferably not on a carpeted surface. Place right / left foot under knee. Extend your opposite leg for support. Keeping your heel down, slide your right / left foot back toward the chair until you feel a stretch at your ankle or calf. If you do not feel a stretch, slide your bottom forward to the edge of the chair, while still keeping your heel down. Hold this stretch for __________ seconds. Repeat __________ times. Complete this stretch __________ times per day.  STRETCH  Gastroc, Standing Place hands on wall. Extend right / left leg, keeping the front knee somewhat bent. Slightly point your toes inward on your back foot. Keeping your right / left heel on the floor and your knee straight, shift your weight toward the wall, not allowing your back to arch. You should feel a gentle stretch in the right / left  calf. Hold this position for __________ seconds. Repeat __________ times. Complete this stretch __________ times per day. STRETCH  Soleus, Standing Place hands on wall. Extend right / left leg, keeping the other knee somewhat bent. Slightly point your toes inward on your back foot. Keep your right / left heel on the floor, bend your back knee, and slightly shift your weight over the back leg so that you feel a gentle stretch deep in your back calf. Hold this position for __________ seconds. Repeat __________ times. Complete this stretch __________ times per day. STRETCH  Gastrocsoleus, Standing  Note: This exercise can place a lot of stress on your foot and ankle. Please complete this exercise only if specifically instructed by your caregiver.  Place the ball of your right / left foot on a step, keeping your other foot firmly on the same step. Hold on to the wall or a rail for balance. Slowly lift your other foot, allowing your body weight to press your heel down over the edge of the step. You should feel a stretch in your right / left calf. Hold this position for __________ seconds. Repeat this exercise with a slight bend in your right / left knee. Repeat __________ times. Complete this stretch __________ times per day.  STRENGTHENING EXERCISES - Plantar Fasciitis (Heel Spur Syndrome)  These exercises may help you when beginning to rehabilitate your injury. They may  resolve your symptoms with or without further involvement from your physician, physical therapist or athletic trainer. While completing these exercises, remember:  Muscles can gain both the endurance and the strength needed for everyday activities through controlled exercises. Complete these exercises as instructed by your physician, physical therapist or athletic trainer. Progress the resistance and repetitions only as guided.

## 2022-01-01 NOTE — Progress Notes (Signed)
Chief Complaint  Patient presents with   Foot Pain    Plantar heel left - aching x few months, AM pain, tried Rx strength Ibuprofen - some help   New Patient (Initial Visit)     HPI: Patient is 66 y.o. female who presents today for pain in the left heel for the last few months.  She works in [employer] and stands for 4 hours a day on her feet.  She is tried different shoes which have failed to relieve her symptoms.  She has pain with first up in the morning and after sitting for long periods of time.  Patient Active Problem List   Diagnosis Date Noted   Chronic insomnia 01/01/2022   Moderate recurrent major depression ([place]) 01/01/2022   Pure hypercholesterolemia 01/01/2022   Aortic atherosclerosis ([place]) 03/06/2021   Pulmonary nodule 03/06/2021   Former smoker 03/06/2021   Chest pain of uncertain etiology 64/40/3474   Mixed hyperlipidemia 12/03/2020   Morbid obesity ([place]) 12/03/2020    Current Outpatient Medications on File Prior to Visit  Medication Sig Dispense Refill   gabapentin (NEURONTIN) 300 MG capsule SMARTSIG:1 Capsule(s) By Mouth Every Evening PRN     Lactobacillus Rhamnosus, GG, (CULTURELLE PO) Take by mouth daily.     Multiple Vitamins-Minerals (MULTIVITAMIN ADULT PO) Take by mouth daily.     simvastatin (ZOCOR) 20 MG tablet SMARTSIG:1 Tablet(s) By Mouth Every Evening     venlafaxine XR (EFFEXOR-XR) 150 MG 24 hr capsule Take 150 mg by mouth daily.     Current Facility-Administered Medications on File Prior to Visit  Medication Dose Route Frequency Provider Last Rate Last Admin   0.9 %  sodium chloride infusion  500 mL Intravenous Continuous Armbruster, Carlota Raspberry, MD        Allergies  Allergen Reactions   Suvorexant Other (See Comments)    Cause night terrors    Review of Systems No fevers, chills, nausea, muscle aches, no difficulty breathing, no calf pain, no chest pain or shortness of breath.   Physical Exam  GENERAL APPEARANCE: Alert, conversant.  Appropriately groomed. No acute distress.   VASCULAR: Pedal pulses palpable DP and PT bilateral.  Capillary refill time is immediate to all digits,  Proximal to distal cooling it warm to warm.  Digital perfusion adequate.   NEUROLOGIC: sensation is intact to 5.07 monofilament at 5/5 sites bilateral.  Light touch is intact bilateral, vibratory sensation intact bilateral  MUSCULOSKELETAL: acceptable muscle strength, tone and stability bilateral.  No gross boney pedal deformities noted.  No pain, crepitus or limitation noted with foot and ankle range of motion bilateral.  Pain on palpation plantar medial aspect of the left heel is noted consistent with plantar fasciitis symptomatology.  DERMATOLOGIC: skin is warm, supple, and dry.  No open lesions noted.  No rash, no pre ulcerative lesions. Digital nails are asymptomatic.    xray is normal.  No infracalcaneal spurring is noted normal foot type is seen.  No acute osseous abnormalities are noted.   Assessment     ICD-10-CM   1. Plantar fasciitis, left  M72.2 DG Foot Complete Left    triamcinolone acetonide (KENALOG) 10 MG/ML injection 10 mg       Plan  Discussed etiology and pathology as well as treatment options and alternatives with the patient.  Discussed Planter fasciitis and recommended an injection.  The patient agreed I prepped the skin with alcohol and infiltrated 10 mg of Kenalog with 0.5% Marcaine plain into the inferior aspect of the  left heel without complication.  A plantar fascial brace was dispensed and she was instructed on its use.  We did discuss getting a Brooks walking shoe that is nonskid to try and wear at work as it does appear that her shoes have contributed to this issue. We also discussed the long-term positive benefits of custom orthotics.  She will be seen back in a month for recheck and follow-up in the meantime she will continue taking her prescription ibuprofen medication and stretching and icing.  If any problems  or concerns arise prior to that visit she will call.

## 2022-01-07 ENCOUNTER — Ambulatory Visit: Payer: Medicare Other

## 2022-01-27 ENCOUNTER — Other Ambulatory Visit: Payer: Self-pay

## 2022-01-27 ENCOUNTER — Ambulatory Visit
Admission: RE | Admit: 2022-01-27 | Discharge: 2022-01-27 | Disposition: A | Discharge status: Home / Self Care | Payer: Medicare Other | Source: Ambulatory Visit | Attending: Physical Medicine and Rehabilitation | Admitting: Physical Medicine and Rehabilitation

## 2022-01-27 DIAGNOSIS — Z1231 Encounter for screening mammogram for malignant neoplasm of breast: Secondary | ICD-10-CM | POA: Diagnosis not present

## 2022-01-29 ENCOUNTER — Other Ambulatory Visit: Payer: Self-pay | Admitting: Physical Medicine and Rehabilitation

## 2022-01-29 ENCOUNTER — Other Ambulatory Visit: Payer: Self-pay | Admitting: *Deleted

## 2022-01-29 DIAGNOSIS — R928 Other abnormal and inconclusive findings on diagnostic imaging of breast: Secondary | ICD-10-CM

## 2022-01-29 DIAGNOSIS — Z87891 Personal history of nicotine dependence: Secondary | ICD-10-CM

## 2022-01-29 DIAGNOSIS — F17210 Nicotine dependence, cigarettes, uncomplicated: Secondary | ICD-10-CM

## 2022-02-03 ENCOUNTER — Encounter: Payer: Self-pay | Admitting: Gastroenterology

## 2022-02-04 ENCOUNTER — Encounter: Payer: Self-pay | Admitting: Podiatry

## 2022-02-04 ENCOUNTER — Ambulatory Visit: Payer: Medicare Other | Admitting: Podiatry

## 2022-02-04 ENCOUNTER — Other Ambulatory Visit: Payer: Self-pay

## 2022-02-04 DIAGNOSIS — M722 Plantar fascial fibromatosis: Secondary | ICD-10-CM | POA: Diagnosis not present

## 2022-02-04 MED ORDER — DEXAMETHASONE SODIUM PHOSPHATE 120 MG/30ML IJ SOLN
4.0000 mg | Freq: Once | INTRAMUSCULAR | Status: AC
  Administered 2022-02-04: 4 mg via INTRA_ARTICULAR

## 2022-02-04 NOTE — Progress Notes (Signed)
?  Subjective:  ?Patient ID: [name], female    DOB: September 18, 1956,   MRN: [id number] ? ?Chief Complaint  ?Patient presents with  ? Plantar Fasciitis  ?  Left foot plantar fasciitis , patient states foot hurt worst now than the last visit   ? ? ?66 y.o. female presents for follow-up of left plantar fasciitis. Relates he foot has worsened. She relates the injection did not help. States the brace and the ibuprofen help a little. Relates she has been stretching occasionally.  . Denies any other pedal complaints. Denies n/v/f/c.  ? ?Past Medical History:  ?Diagnosis Date  ? Abnormal EKG   ? Brain cyst   ? Chest pain   ? Depression   ? Insomnia   ? ? ?Objective:  ?Physical Exam: ?Vascular: DP/PT pulses 2/4 bilateral. CFT <3 seconds. Normal hair growth on digits. No edema.  ?Skin. No lacerations or abrasions bilateral feet.  ?Musculoskeletal: MMT 5/5 bilateral lower extremities in DF, PF, Inversion and Eversion. Deceased ROM in DF of ankle joint.  Tender to medial calcaneal tubercle on the left. No pain to arch or along PT or achilles tendon. No pain with calcaneal squeeze.  ?Neurological: Sensation intact to light touch.  ? ?Assessment:  ? ?1. Plantar fasciitis, left   ? ? ? ?Plan:  ?Patient was evaluated and treated and all questions answered. ?Discussed plantar fasciitis with patient.  ?X-rays reviewed and discussed with patient. No acute fractures or dislocations noted. Mild spurring noted at inferior calcaneus.  ?Discussed treatment options including, ice, NSAIDS, supportive shoes, bracing, and stretching.  ?Continue stretching. Will refer to PT.  ?Continue ibuprofen.  ?Patient requesting injection today. Procedure note below.   ?Follow-up 8 weeks or sooner if any problems arise. In the meantime, encouraged to call the office with any questions, concerns, change in symptoms.  ? ?Procedure:  ?Discussed etiology, pathology, conservative vs. surgical therapies. At this time a plantar fascial injection was  recommended.  The patient agreed and a sterile skin prep was applied.  An injection consisting of  dexamethasone and marcaine mixture was infiltrated at the point of maximal tenderness on the left Heel.  Bandaid applied. The patient tolerated this well and was given instructions for aftercare.  ? ? ?Lorenda Peck, DPM  ? ? ?

## 2022-02-04 NOTE — Progress Notes (Signed)
plantar ?

## 2022-02-10 ENCOUNTER — Encounter: Payer: Self-pay | Admitting: Acute Care

## 2022-02-10 ENCOUNTER — Ambulatory Visit (INDEPENDENT_AMBULATORY_CARE_PROVIDER_SITE_OTHER): Payer: Medicare Other | Admitting: Acute Care

## 2022-02-10 ENCOUNTER — Other Ambulatory Visit: Payer: Self-pay

## 2022-02-10 DIAGNOSIS — F17210 Nicotine dependence, cigarettes, uncomplicated: Secondary | ICD-10-CM

## 2022-02-10 NOTE — Progress Notes (Addendum)
Virtual Visit via Telephone Note ? ?I connected with [name] on 02/13/22 at  4:30 PM EDT by telephone and verified that I am speaking with the correct person using two identifiers. ? ?Location: ?Patient:  At home ?Provider:  [place], [place], [place], [address]  ?  ?I discussed the limitations, risks, security and privacy concerns of performing an evaluation and management service by telephone and the availability of in person appointments. I also discussed with the patient that there may be a patient responsible charge related to this service. The patient expressed understanding and agreed to proceed. ? ?.Shared Decision Making Visit Lung Cancer Screening Program ?([phone]) ? ? ?Eligibility: ?Age 66 y.o. ?Pack Years Smoking History Calculation 63 pack year smoking history ?(# packs/per year x # years smoked) ?Recent History of coughing up blood  no ?Unexplained weight loss? no ?( >Than 15 pounds within the last 6 months ) ?Prior History Lung / other cancer no ?(Diagnosis within the last 5 years already requiring surveillance chest CT Scans). ?Smoking Status Current Smoker ?Former Smokers: Years since quit:  NA ? Quit Date:  NA ? ?Visit Components: ?Discussion included one or more decision making aids. yes ?Discussion included risk/benefits of screening. yes ?Discussion included potential follow up diagnostic testing for abnormal scans. yes ?Discussion included meaning and risk of over diagnosis. yes ?Discussion included meaning and risk of False Positives. yes ?Discussion included meaning of total radiation exposure. yes ? ?Counseling Included: ?Importance of adherence to annual lung cancer LDCT screening. yes ?Impact of comorbidities on ability to participate in the program. yes ?Ability and willingness to under diagnostic treatment. yes ? ?Smoking Cessation Counseling: ?Current Smokers:  ?Discussed importance of smoking cessation. yes ?Information about tobacco cessation classes and  interventions provided to patient. yes ?Patient provided with "ticket" for LDCT Scan. yes ?Symptomatic Patient. no ? Counseling NA ?Diagnosis Code: Tobacco Use Z72.0 ?Asymptomatic Patient yes ? Counseling (Intermediate counseling: > three minutes counseling) S9233 ?Former Smokers:  ?Discussed the importance of maintaining cigarette abstinence. yes ?Diagnosis Code: Personal History of Nicotine Dependence. A07.622 ?Information about tobacco cessation classes and interventions provided to patient. Yes ?Patient provided with "ticket" for LDCT Scan. yes ?Written Order for Lung Cancer Screening with LDCT placed in Epic. Yes ?(CT Chest Lung Cancer Screening Low Dose W/O CM) [id number] ?Z12.2-Screening of respiratory organs ?Z87.891-Personal history of nicotine dependence ? ?I have spent 25 minutes of face to face/ virtual visit   time with  [name] discussing the risks and benefits of lung cancer screening. We viewed / discussed a power point together that explained in detail the above noted topics. We paused at intervals to allow for questions to be asked and answered to ensure understanding.We discussed that the single most powerful action that she can take to decrease her risk of developing lung cancer is to quit smoking. We discussed whether or not she is ready to commit to setting a quit date. We discussed options for tools to aid in quitting smoking including nicotine replacement therapy, non-nicotine medications, support groups, Quit Smart classes, and behavior modification. We discussed that often times setting smaller, more achievable goals, such as eliminating 1 cigarette a day for a week and then 2 cigarettes a day for a week can be helpful in slowly decreasing the number of cigarettes smoked. This allows for a sense of accomplishment as well as providing a clinical benefit. I provided  her  with smoking cessation  information  with contact information for community resources, classes,  free nicotine replacement  therapy, and access to mobile apps, text messaging, and on-line smoking cessation help. I have also provided  her  the office contact information in the event she needs to contact me, or the screening staff. We discussed the time and location of the scan, and that either Doroteo Glassman RN, Joella Prince, RN  or I will call / send a letter with the results within 24-72 hours of receiving them. The patient verbalized understanding of all of  the above and had no further questions upon leaving the office. They have my contact information in the event they have any further questions. ? ?I spent 3 minutes counseling on smoking cessation and the health risks of continued tobacco abuse. ? ?I explained to the patient that there has been a high incidence of coronary artery disease noted on these exams. I explained that this is a non-gated exam therefore degree or severity cannot be determined. This patient is on statin therapy. I have asked the patient to follow-up with their PCP regarding any incidental finding of coronary artery disease and management with diet or medication as their PCP  feels is clinically indicated. The patient verbalized understanding of the above and had no further questions upon completion of the visit. ? ?  ? ? ?Magdalen Spatz, NP ?02/10/2022 ? ? ? ? ? ? ?

## 2022-02-10 NOTE — Patient Instructions (Signed)
Thank you for participating in the [facility] Cancer Screening Program. °It was our pleasure to meet you today. °We will call you with the results of your scan within the next few days. °Your scan will be assigned a Lung RADS category score by the physicians reading the scans.  °This Lung RADS score determines follow up scanning.  °See below for description of categories, and follow up screening recommendations. °We will be in touch to schedule your follow up screening annually or based on recommendations of our providers. °We will fax a copy of your scan results to your Primary Care Physician, or the physician who referred you to the program, to ensure they have the results. °Please call the office if you have any questions or concerns regarding your scanning experience or results.  °Our office number is [phone]. °Please speak with Denise Phelps, RN. She is our Lung Cancer Screening RN. °If she is unavailable when you call, please have the office staff send her a message. She will return your call at her earliest convenience. °Remember, if your scan is normal, we will scan you annually as long as you continue to meet the criteria for the program. (Age 55-77, Current smoker or smoker who has quit within the last 15 years). °If you are a smoker, remember, quitting is the single most powerful action that you can take to decrease your risk of lung cancer and other pulmonary, breathing related problems. °We know quitting is hard, and we are here to help.  °Please let us know if there is anything we can do to help you meet your goal of quitting. °If you are a former smoker, congratulations. We are proud of you! Remain smoke free! °Remember you can refer friends or family members through the number above.  °We will screen them to make sure they meet criteria for the program. °Thank you for helping us take better care of you by participating in Lung Screening. ° °You can receive free nicotine replacement therapy  ( patches, gum or mints) by calling 1-800-QUIT NOW. Please call so we can get you on the path to becoming  a non-smoker. I know it is hard, but you can do this! ° °Lung RADS Categories: ° °Lung RADS 1: no nodules or definitely non-concerning nodules.  °Recommendation is for a repeat annual scan in 12 months. ° °Lung RADS 2:  nodules that are non-concerning in appearance and behavior with a very low likelihood of becoming an active cancer. °Recommendation is for a repeat annual scan in 12 months. ° °Lung RADS 3: nodules that are probably non-concerning , includes nodules with a low likelihood of becoming an active cancer.  Recommendation is for a 6-month repeat screening scan. Often noted after an upper respiratory illness. We will be in touch to make sure you have no questions, and to schedule your 6-month scan. ° °Lung RADS 4 A: nodules with concerning findings, recommendation is most often for a follow up scan in 3 months or additional testing based on our provider's assessment of the scan. We will be in touch to make sure you have no questions and to schedule the recommended 3 month follow up scan. ° °Lung RADS 4 B:  indicates findings that are concerning. We will be in touch with you to schedule additional diagnostic testing based on our provider's  assessment of the scan. ° °Hypnosis for smoking cessation  °[employer] °[phone] ° °Acupuncture for smoking cessation  °[facility] °[phone]  °

## 2022-02-12 ENCOUNTER — Ambulatory Visit
Admission: RE | Admit: 2022-02-12 | Discharge: 2022-02-12 | Disposition: A | Discharge status: Home / Self Care | Payer: Medicare Other | Source: Ambulatory Visit | Attending: Acute Care | Admitting: Acute Care

## 2022-02-12 ENCOUNTER — Other Ambulatory Visit: Payer: Self-pay

## 2022-02-12 DIAGNOSIS — F17210 Nicotine dependence, cigarettes, uncomplicated: Secondary | ICD-10-CM | POA: Diagnosis not present

## 2022-02-12 DIAGNOSIS — I700 Atherosclerosis of aorta: Secondary | ICD-10-CM | POA: Diagnosis not present

## 2022-02-12 DIAGNOSIS — R918 Other nonspecific abnormal finding of lung field: Secondary | ICD-10-CM | POA: Diagnosis not present

## 2022-02-12 DIAGNOSIS — M439 Deforming dorsopathy, unspecified: Secondary | ICD-10-CM | POA: Diagnosis not present

## 2022-02-16 ENCOUNTER — Other Ambulatory Visit: Payer: Self-pay | Admitting: Acute Care

## 2022-02-16 DIAGNOSIS — Z87891 Personal history of nicotine dependence: Secondary | ICD-10-CM

## 2022-02-16 DIAGNOSIS — F17210 Nicotine dependence, cigarettes, uncomplicated: Secondary | ICD-10-CM

## 2022-02-18 ENCOUNTER — Other Ambulatory Visit: Payer: Self-pay | Admitting: Family Medicine

## 2022-02-18 ENCOUNTER — Other Ambulatory Visit: Payer: Medicare Other

## 2022-02-25 NOTE — Therapy (Incomplete)
?OUTPATIENT PHYSICAL THERAPY LOWER EXTREMITY EVALUATION ? ? ?Patient Name: [name] ?MRN: [id number] ?DOB:10-09-1956, 66 y.o., female ?Today's Date: 02/25/2022 ? ? ? ?Past Medical History:  ?Diagnosis Date  ? Abnormal EKG   ? Brain cyst   ? Chest pain   ? Depression   ? Insomnia   ? ?Past Surgical History:  ?Procedure Laterality Date  ? BRAIN SURGERY    ? removed cyst  ? CHOLECYSTECTOMY    ? COLONOSCOPY    ? ?Patient Active Problem List  ? Diagnosis Date Noted  ? Chronic insomnia 01/01/2022  ? Moderate recurrent major depression ([facility] [place]) 01/01/2022  ? Pure hypercholesterolemia 01/01/2022  ? Aortic atherosclerosis ([facility]) 03/06/2021  ? Pulmonary nodule 03/06/2021  ? Former smoker 03/06/2021  ? Chest pain of uncertain etiology 28/31/5176  ? Mixed hyperlipidemia 12/03/2020  ? Morbid obesity ([facility]) 12/03/2020  ? ? ?PCP: London Pepper, MD ? ?REFERRING PROVIDER: Lorenda Peck, MD ? ?REFERRING DIAG: Left plantar fasciitis stretching and gait training as well as other modalities ? ?THERAPY DIAG:  ?No diagnosis found. ? ?ONSET DATE: *** ? ?SUBJECTIVE:  ? ?SUBJECTIVE STATEMENT: ?*** ? ?PERTINENT HISTORY: ?*** ? ?PAIN:  ?Are you having pain? {OPRCPAIN:27236} ? ?PRECAUTIONS: {Therapy precautions:24002} ? ?WEIGHT BEARING RESTRICTIONS {Yes ***/No:24003} ? ?FALLS:  ?Has patient fallen in last 6 months? {fallsyesno:27318} ? ?LIVING ENVIRONMENT: ?Lives with: {OPRC lives with:25569::"lives with their family"} ?Lives in: {Lives in:25570} ?Stairs: {opstairs:27293} ?Has following equipment at home: {Assistive devices:23999} ? ?OCCUPATION: *** ? ?PLOF: {PLOF:24004} ? ?PATIENT GOALS *** ? ? ?OBJECTIVE:  ? ?DIAGNOSTIC FINDINGS:  ?02/04/22 X-rays reviewed and discussed with patient. No acute fractures or dislocations noted. Mild spurring noted at inferior calcaneus.  ? ?PATIENT SURVEYS:  ?{rehab surveys:24030} ? ?COGNITION: ? Overall cognitive status: {cognition:24006}   ?  ?SENSATION: ?{sensation:27233} ? ?MUSCLE LENGTH: ?Hamstrings: Right  *** deg; Left *** deg ?Thomas test: Right *** deg; Left *** deg ? ?POSTURE:  ?*** ? ?PALPATION: ?*** ? ?LE ROM: ? ?{AROM/PROM:27142} ROM Right ?02/25/2022 Left ?02/25/2022  ?Hip flexion    ?Hip extension    ?Hip abduction    ?Hip adduction    ?Hip internal rotation    ?Hip external rotation    ?Knee flexion    ?Knee extension    ?Ankle dorsiflexion    ?Ankle plantarflexion    ?Ankle inversion    ?Ankle eversion    ? (Blank rows = not tested) ? ?LE MMT: ? ?MMT Right ?02/25/2022 Left ?02/25/2022  ?Hip flexion    ?Hip extension    ?Hip abduction    ?Hip adduction    ?Hip internal rotation    ?Hip external rotation    ?Knee flexion    ?Knee extension    ?Ankle dorsiflexion    ?Ankle plantarflexion    ?Ankle inversion    ?Ankle eversion    ? (Blank rows = not tested) ? ?LOWER EXTREMITY SPECIAL TESTS:  ?{LEspecialtests:26242} ? ?FUNCTIONAL TESTS:  ?{Functional tests:24029} ? ?GAIT: ?Distance walked: *** ?Assistive device utilized: {Assistive devices:23999} ?Level of assistance: {Levels of assistance:24026} ?Comments: *** ? ? ? ?TODAY'S TREATMENT: ?*** ? ? ?PATIENT EDUCATION:  ?Education details: *** ?Person educated: {Person educated:25204} ?Education method: {Education Method:25205} ?Education comprehension: {Education Comprehension:25206} ? ? ?HOME EXERCISE PROGRAM: ?*** ? ?ASSESSMENT: ? ?CLINICAL IMPRESSION: ?Patient is a *** y.o. *** who was seen today for physical therapy evaluation and treatment for ***.  ? ? ?OBJECTIVE IMPAIRMENTS {opptimpairments:25111}.  ? ?ACTIVITY LIMITATIONS {activity limitations:25113}.  ? ?PERSONAL FACTORS {Personal factors:25162} are also affecting patient's functional outcome.  ? ? ?  REHAB POTENTIAL: {rehabpotential:25112} ? ?CLINICAL DECISION MAKING: {clinical decision making:25114} ? ?EVALUATION COMPLEXITY: {Evaluation complexity:25115} ? ? ?GOALS: ?Goals reviewed with patient? {yes/no:20286} ? ?SHORT TERM GOALS: Target date: {follow up:25551} ? ?*** ?Baseline: ?Goal status:  {GOALSTATUS:25110} ? ?2.  *** ?Baseline:  ?Goal status: {GOALSTATUS:25110} ? ?3.  *** ?Baseline:  ?Goal status: {GOALSTATUS:25110} ? ?4.  *** ?Baseline:  ?Goal status: {GOALSTATUS:25110} ? ?5.  *** ?Baseline:  ?Goal status: {GOALSTATUS:25110} ? ?6.  *** ?Baseline:  ?Goal status: {GOALSTATUS:25110} ? ?LONG TERM GOALS: Target date: {follow up:25551} ? ?*** ?Baseline:  ?Goal status: {GOALSTATUS:25110} ? ?2.  *** ?Baseline:  ?Goal status: {GOALSTATUS:25110} ? ?3.  *** ?Baseline:  ?Goal status: {GOALSTATUS:25110} ? ?4.  *** ?Baseline:  ?Goal status: {GOALSTATUS:25110} ? ?5.  *** ?Baseline:  ?Goal status: {GOALSTATUS:25110} ? ?6.  *** ?Baseline:  ?Goal status: {GOALSTATUS:25110} ? ? ?PLAN: ?PT FREQUENCY: {rehab frequency:25116} ? ?PT DURATION: {rehab duration:25117} ? ?PLANNED INTERVENTIONS: {rehab planned interventions:25118::"Therapeutic exercises","Therapeutic activity","Neuromuscular re-education","Balance training","Gait training","Patient/Family education","Joint mobilization"} ? ?PLAN FOR NEXT SESSION: *** ? ? ?Gar Ponto, PT ?02/25/2022, 10:58 PM ? ?

## 2022-02-26 ENCOUNTER — Ambulatory Visit: Payer: Medicare Other | Attending: Podiatry

## 2022-03-04 ENCOUNTER — Ambulatory Visit
Admission: RE | Admit: 2022-03-04 | Discharge: 2022-03-04 | Disposition: A | Discharge status: Home / Self Care | Payer: Medicare Other | Source: Ambulatory Visit | Attending: Physical Medicine and Rehabilitation | Admitting: Physical Medicine and Rehabilitation

## 2022-03-04 ENCOUNTER — Ambulatory Visit: Payer: Medicare Other

## 2022-03-04 DIAGNOSIS — R922 Inconclusive mammogram: Secondary | ICD-10-CM | POA: Diagnosis not present

## 2022-03-04 DIAGNOSIS — R928 Other abnormal and inconclusive findings on diagnostic imaging of breast: Secondary | ICD-10-CM

## 2022-03-18 ENCOUNTER — Ambulatory Visit: Payer: Medicare Other | Admitting: Podiatry

## 2022-03-18 ENCOUNTER — Encounter: Payer: Self-pay | Admitting: Podiatry

## 2022-03-18 DIAGNOSIS — M722 Plantar fascial fibromatosis: Secondary | ICD-10-CM

## 2022-03-18 MED ORDER — DEXAMETHASONE SODIUM PHOSPHATE 120 MG/30ML IJ SOLN: 4.0000 mg | Freq: Once | INTRAMUSCULAR | Status: DC

## 2022-03-18 NOTE — Progress Notes (Signed)
?  Subjective:  ?Patient ID: [name], female    DOB: 04-21-1956,   MRN: [id number] ? ?No chief complaint on file. ? ? ?65 y.o. female presents for follow-up of left plantar fasciitis. Relates injection did help a more this time and is doing better. Never started PT had some confusion with scheduling and got frustrated. Overall doing better.  Relates she has been stretching occasionally.  . Denies any other pedal complaints. Denies n/v/f/c.  ? ?Past Medical History:  ?Diagnosis Date  ? Abnormal EKG   ? Brain cyst   ? Chest pain   ? Depression   ? Insomnia   ? ? ?Objective:  ?Physical Exam: ?Vascular: DP/PT pulses 2/4 bilateral. CFT <3 seconds. Normal hair growth on digits. No edema.  ?Skin. No lacerations or abrasions bilateral feet.  ?Musculoskeletal: MMT 5/5 bilateral lower extremities in DF, PF, Inversion and Eversion. Deceased ROM in DF of ankle joint.  Mildly tender to medial calcaneal tubercle on the left more pain laterally today. Marland Kitchen No pain to arch or along PT or achilles tendon. No pain with calcaneal squeeze.  ?Neurological: Sensation intact to light touch.  ? ?Assessment:  ? ?1. Plantar fasciitis, left   ? ? ? ?Plan:  ?Patient was evaluated and treated and all questions answered. ?Discussed plantar fasciitis with patient.  ?X-rays reviewed and discussed with patient. No acute fractures or dislocations noted. Mild spurring noted at inferior calcaneus.  ?Discussed treatment options including, ice, NSAIDS, supportive shoes, bracing, and stretching.  ?Continue stretching. Patient does not want to do PT anymore.  ?Continue ibuprofen.  ?Patient requesting injection today. Procedure note below.   ?Follow-up as needed ? ?Procedure:  ?Discussed etiology, pathology, conservative vs. surgical therapies. At this time a plantar fascial injection was recommended.  The patient agreed and a sterile skin prep was applied.  An injection consisting of  dexamethasone and marcaine mixture was infiltrated at the point of  maximal tenderness on the left Heel.  Bandaid applied. The patient tolerated this well and was given instructions for aftercare.  ? ? ?Lorenda Peck, DPM  ? ? ?

## 2022-06-08 DIAGNOSIS — G4700 Insomnia, unspecified: Secondary | ICD-10-CM | POA: Diagnosis not present

## 2022-06-15 ENCOUNTER — Ambulatory Visit (INDEPENDENT_AMBULATORY_CARE_PROVIDER_SITE_OTHER): Payer: Medicare Other

## 2022-06-15 ENCOUNTER — Ambulatory Visit: Payer: Medicare Other | Admitting: Physician Assistant

## 2022-06-15 ENCOUNTER — Encounter: Payer: Self-pay | Admitting: Physician Assistant

## 2022-06-15 VITALS — Ht 65.0 in | Wt 204.0 lb

## 2022-06-15 DIAGNOSIS — M25562 Pain in left knee: Secondary | ICD-10-CM

## 2022-06-15 MED ORDER — BUPIVACAINE HCL 0.25 % IJ SOLN
2.0000 mL | INTRAMUSCULAR | Status: AC | PRN
  Administered 2022-06-15: 2 mL via INTRA_ARTICULAR

## 2022-06-15 MED ORDER — LIDOCAINE HCL 1 % IJ SOLN
2.0000 mL | INTRAMUSCULAR | Status: AC | PRN
  Administered 2022-06-15: 2 mL

## 2022-06-15 MED ORDER — METHYLPREDNISOLONE ACETATE 40 MG/ML IJ SUSP
80.0000 mg | INTRAMUSCULAR | Status: AC | PRN
  Administered 2022-06-15: 80 mg via INTRA_ARTICULAR

## 2022-06-15 MED ORDER — MELOXICAM 15 MG PO TABS: 15.0000 mg | ORAL_TABLET | Freq: Every day | ORAL | 0 refills | Status: DC

## 2022-06-15 NOTE — Progress Notes (Signed)
Office Visit Note   Patient: [name]           Date of Birth: [birth year]           MRN: [id number] Visit Date: 06/15/2022              Requested by: London Pepper, MD [address],  [place] [address] PCP: London Pepper, MD  Chief Complaint  Patient presents with  . Left Knee - New Patient (Initial Visit)      HPI: Patient is a pleasant 66 year old woman with a chief complaint of left knee pain.  She said this has been going on for couple weeks.  She is trying to increase her activity and states she noticed this when she was doing intervals of walking and running on a treadmill.  She does enjoy walking every day.  She denies any locking or catching.  Pain is on the medial side of the knee.  Assessment & Plan: Visit Diagnoses:  1. 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  I think she would benefit from an injection today she like to go forward with this as she has had steroid injections in the past and done well.  We will also call her in meloxicam instead of taking ibuprofen.  She understands not to take the both while she is taking the meloxicam.  If she does not get good relief from the steroid we could consider gel supplementation  Follow-Up Instructions: No follow-ups on file.   Ortho Exam  Patient is alert, oriented, no adenopathy, well-dressed, normal affect, normal respiratory effort. Examination of her left knee no effusion no redness no cellulitis.  She does have some tenderness over the medial joint line.  Good varus valgus stability no tenderness around the patella or lateral joint line.  Imaging: No results found. No images are attached to the encounter.  Labs: No results found for: "HGBA1C", "ESRSEDRATE", "CRP", "LABURIC", "REPTSTATUS", "GRAMSTAIN", "CULT", "LABORGA"   No results found for: "ALBUMIN", "PREALBUMIN", "CBC"  No results found for: "MG" No  results found for: "VD25OH"  No results found for: "PREALBUMIN"     No data to display           Body mass index is 33.95 kg/m.  Orders:  Orders Placed This Encounter  Procedures  . XR KNEE 3 VIEW LEFT   Meds ordered this encounter  Medications  . meloxicam (MOBIC) 15 MG tablet    Sig: Take 1 tablet (15 mg total) by mouth daily.    Dispense:  30 tablet    Refill:  0     Procedures: Large Joint Inj: L knee on 06/15/2022 4:42 PM Indications: pain and diagnostic evaluation Details: 25 G 1.5 in needle, anteromedial approach  Arthrogram: No  Medications: 80 mg methylPREDNISolone acetate 40 MG/ML; 2 mL lidocaine 1 %; 2 mL bupivacaine 0.25 % Outcome: tolerated well, no immediate complications Procedure, treatment alternatives, risks and benefits explained, specific risks discussed. Consent was given by the patient.    Clinical Data: No additional findings.  ROS:  All other systems negative, except as noted in the HPI. Review of Systems  Objective: Vital Signs: Ht '5\' 5"'$  (1.651 m)   Wt 204 lb (92.5 kg)   BMI 33.95 kg/m   Specialty Comments:  No specialty comments available.  PMFS History: Patient Active Problem List   Diagnosis Date Noted  .  Chronic insomnia 01/01/2022  . Moderate recurrent major depression ([facility]) 01/01/2022  . Pure hypercholesterolemia 01/01/2022  . Aortic atherosclerosis ([facility]) 03/06/2021  . Pulmonary nodule 03/06/2021  . Former smoker 03/06/2021  . Chest pain of uncertain etiology 88/91/6945  . Mixed hyperlipidemia 12/03/2020  . Morbid obesity ([place]) 12/03/2020   Past Medical History:  Diagnosis Date  . Abnormal EKG   . Brain cyst   . Chest pain   . Depression   . Insomnia     Family History  Problem Relation Age of Onset  . Cancer Mother        LIVER  . Gout Father   . Other Father        PNUEMONIA  . Hypertension Father   . CAD Father   . Heart failure Father   . Diabetes Father   . Obesity Sister   . Depression Sister    . Other Sister        BACK PROBLEMS  . Breast cancer Maternal Aunt   . Cancer - Prostate Brother   . Diabetes Brother   . Cancer - Other Brother        SKIN  . Colon cancer Neg Hx   . Esophageal cancer Neg Hx   . Rectal cancer Neg Hx   . Stomach cancer Neg Hx     Past Surgical History:  Procedure Laterality Date  . BRAIN SURGERY     removed cyst  . CHOLECYSTECTOMY    . COLONOSCOPY     Social History   Occupational History  . Not on file  Tobacco Use  . Smoking status: Every Day    Packs/day: 1.75    Years: 36.00    Total pack years: 63.00    Types: Cigarettes    Last attempt to quit: 02/16/2004    Years since quitting: 18.3  . Smokeless tobacco: Never  Substance and Sexual Activity  . Alcohol use: No  . Drug use: No  . Sexual activity: Not on file

## 2022-06-22 ENCOUNTER — Telehealth: Payer: Self-pay | Admitting: Physician Assistant

## 2022-06-22 NOTE — Telephone Encounter (Signed)
Pt called requesting left knee gel injections. Please submit to insurance company and call when approved. Pt phone number is [phone] or [phone]

## 2022-06-23 NOTE — Telephone Encounter (Signed)
Submitted for VOB for synivsc left knee

## 2022-06-25 ENCOUNTER — Telehealth: Payer: Self-pay

## 2022-06-25 NOTE — Telephone Encounter (Signed)
Called and advised pt and scheduled with maryann for friday

## 2022-06-25 NOTE — Telephone Encounter (Signed)
Approved for Durolane-left knee Buy and bill Covered @ 80% $84 copay No precert required

## 2022-06-26 ENCOUNTER — Encounter: Payer: Self-pay | Admitting: Physician Assistant

## 2022-06-26 ENCOUNTER — Ambulatory Visit: Payer: Medicare Other | Admitting: Physician Assistant

## 2022-06-26 DIAGNOSIS — M1712 Unilateral primary osteoarthritis, left knee: Secondary | ICD-10-CM

## 2022-06-26 MED ORDER — SODIUM HYALURONATE 60 MG/3ML IX PRSY
60.0000 mg | PREFILLED_SYRINGE | INTRA_ARTICULAR | Status: AC | PRN
  Administered 2022-06-26: 60 mg via INTRA_ARTICULAR

## 2022-06-26 NOTE — Progress Notes (Signed)
Office Visit Note   Patient: [name]           Date of Birth: [birth year]           MRN: [id number] Visit Date: 06/26/2022              Requested by: London Pepper, MD [facility] [address],  [place] [address] PCP: London Pepper, MD  Chief Complaint  Patient presents with  . Left Knee - Follow-up      HPI: Patient is a pleasant 66 year old woman with a history of some left knee arthritis.  She did have a steroid injection.  She thought it helped a little bit.  She said it did take about a week.  She is here today to try gel injections.  She has had x-rays done which show some joint space narrowing and CPPD  Assessment & Plan: Visit Diagnoses:  1. Unilateral primary osteoarthritis, left knee     Plan: Patient will follow-up in 1 week.  Follow-Up Instructions: Return in about 1 week (around 07/03/2022).   Ortho Exam  Patient is alert, oriented, no adenopathy, well-dressed, normal affect, normal respiratory effort. Examination of her left knee no effusion mild soft tissue swelling no cellulitis  Imaging: No results found. No images are attached to the encounter.  Labs: No results found for: "HGBA1C", "ESRSEDRATE", "CRP", "LABURIC", "REPTSTATUS", "GRAMSTAIN", "CULT", "LABORGA"   No results found for: "ALBUMIN", "PREALBUMIN", "CBC"  No results found for: "MG" No results found for: "VD25OH"  No results found for: "PREALBUMIN"     No data to display           There is no height or weight on file to calculate BMI.  Orders:  No orders of the defined types were placed in this encounter.  No orders of the defined types were placed in this encounter.    Procedures: Large Joint Inj: L knee on 06/26/2022 1:36 PM Indications: pain and diagnostic evaluation Details: 25 G 1.5 in needle, anteromedial approach  Arthrogram: No  Medications: 60 mg Sodium Hyaluronate 60 MG/3ML Outcome: tolerated well, no immediate complications Procedure,  treatment alternatives, risks and benefits explained, specific risks discussed. Consent was given by the patient.     Clinical Data: No additional findings.  ROS:  All other systems negative, except as noted in the HPI. Review of Systems  Objective: Vital Signs: There were no vitals taken for this visit.  Specialty Comments:  No specialty comments available.  PMFS History: Patient Active Problem List   Diagnosis Date Noted  . Unilateral primary osteoarthritis, left knee 06/26/2022  . Pain in left knee 06/15/2022  . Chronic insomnia 01/01/2022  . Moderate recurrent major depression ([place]) 01/01/2022  . Pure hypercholesterolemia 01/01/2022  . Aortic atherosclerosis ([place]) 03/06/2021  . Pulmonary nodule 03/06/2021  . Former smoker 03/06/2021  . Chest pain of uncertain etiology 34/19/3790  . Mixed hyperlipidemia 12/03/2020  . Morbid obesity ([place]) 12/03/2020   Past Medical History:  Diagnosis Date  . Abnormal EKG   . Brain cyst   . Chest pain   . Depression   . Insomnia     Family History  Problem Relation Age of Onset  . Cancer Mother        LIVER  . Gout Father   . Other Father        PNUEMONIA  . Hypertension Father   . CAD Father   . Heart failure Father   . Diabetes Father   .  Obesity Sister   . Depression Sister   . Other Sister        BACK PROBLEMS  . Breast cancer Maternal Aunt   . Cancer - Prostate Brother   . Diabetes Brother   . Cancer - Other Brother        SKIN  . Colon cancer Neg Hx   . Esophageal cancer Neg Hx   . Rectal cancer Neg Hx   . Stomach cancer Neg Hx     Past Surgical History:  Procedure Laterality Date  . BRAIN SURGERY     removed cyst  . CHOLECYSTECTOMY    . COLONOSCOPY     Social History   Occupational History  . Not on file  Tobacco Use  . Smoking status: Every Day    Packs/day: 1.75    Years: 36.00    Total pack years: 63.00    Types: Cigarettes    Last attempt to quit: 02/16/2004    Years since quitting: 18.3   . Smokeless tobacco: Never  Substance and Sexual Activity  . Alcohol use: No  . Drug use: No  . Sexual activity: Not on file

## 2022-07-03 ENCOUNTER — Ambulatory Visit: Payer: Medicare Other | Admitting: Physician Assistant

## 2022-07-10 ENCOUNTER — Ambulatory Visit: Payer: Medicare Other | Admitting: Physician Assistant

## 2022-11-06 DIAGNOSIS — F17200 Nicotine dependence, unspecified, uncomplicated: Secondary | ICD-10-CM | POA: Diagnosis not present

## 2022-11-06 DIAGNOSIS — Z1211 Encounter for screening for malignant neoplasm of colon: Secondary | ICD-10-CM | POA: Diagnosis not present

## 2022-11-06 DIAGNOSIS — R911 Solitary pulmonary nodule: Secondary | ICD-10-CM | POA: Diagnosis not present

## 2022-11-06 DIAGNOSIS — Z5181 Encounter for therapeutic drug level monitoring: Secondary | ICD-10-CM | POA: Diagnosis not present

## 2022-11-06 DIAGNOSIS — Z23 Encounter for immunization: Secondary | ICD-10-CM | POA: Diagnosis not present

## 2022-11-06 DIAGNOSIS — E785 Hyperlipidemia, unspecified: Secondary | ICD-10-CM | POA: Diagnosis not present

## 2022-11-06 DIAGNOSIS — Z0000 Encounter for general adult medical examination without abnormal findings: Secondary | ICD-10-CM | POA: Diagnosis not present

## 2022-11-11 ENCOUNTER — Other Ambulatory Visit: Payer: Self-pay | Admitting: Family Medicine

## 2022-11-11 DIAGNOSIS — E2839 Other primary ovarian failure: Secondary | ICD-10-CM

## 2023-02-15 ENCOUNTER — Ambulatory Visit
Admission: RE | Admit: 2023-02-15 | Discharge: 2023-02-15 | Disposition: A | Discharge status: Home / Self Care | Payer: Medicare Other | Source: Ambulatory Visit | Attending: Family Medicine | Admitting: Family Medicine

## 2023-02-15 DIAGNOSIS — J984 Other disorders of lung: Secondary | ICD-10-CM | POA: Diagnosis not present

## 2023-02-15 DIAGNOSIS — M439 Deforming dorsopathy, unspecified: Secondary | ICD-10-CM | POA: Diagnosis not present

## 2023-02-15 DIAGNOSIS — Z87891 Personal history of nicotine dependence: Secondary | ICD-10-CM

## 2023-02-15 DIAGNOSIS — F17210 Nicotine dependence, cigarettes, uncomplicated: Secondary | ICD-10-CM

## 2023-02-15 DIAGNOSIS — I700 Atherosclerosis of aorta: Secondary | ICD-10-CM | POA: Diagnosis not present

## 2023-02-16 ENCOUNTER — Other Ambulatory Visit: Payer: Self-pay | Admitting: Acute Care

## 2023-02-16 DIAGNOSIS — Z122 Encounter for screening for malignant neoplasm of respiratory organs: Secondary | ICD-10-CM

## 2023-02-16 DIAGNOSIS — F17210 Nicotine dependence, cigarettes, uncomplicated: Secondary | ICD-10-CM

## 2023-02-16 DIAGNOSIS — Z87891 Personal history of nicotine dependence: Secondary | ICD-10-CM

## 2023-02-25 ENCOUNTER — Other Ambulatory Visit: Payer: Self-pay | Admitting: Family Medicine

## 2023-02-25 DIAGNOSIS — Z0000 Encounter for general adult medical examination without abnormal findings: Secondary | ICD-10-CM

## 2023-04-12 ENCOUNTER — Ambulatory Visit
Admission: RE | Admit: 2023-04-12 | Discharge: 2023-04-12 | Disposition: A | Discharge status: Home / Self Care | Payer: Medicare Other | Source: Ambulatory Visit | Attending: Family Medicine | Admitting: Family Medicine

## 2023-04-12 DIAGNOSIS — Z1231 Encounter for screening mammogram for malignant neoplasm of breast: Secondary | ICD-10-CM | POA: Diagnosis not present

## 2023-04-12 DIAGNOSIS — Z0000 Encounter for general adult medical examination without abnormal findings: Secondary | ICD-10-CM

## 2023-05-05 ENCOUNTER — Ambulatory Visit
Admission: RE | Admit: 2023-05-05 | Discharge: 2023-05-05 | Disposition: A | Discharge status: Home / Self Care | Payer: Medicare Other | Source: Ambulatory Visit | Attending: Family Medicine | Admitting: Family Medicine

## 2023-05-05 DIAGNOSIS — E2839 Other primary ovarian failure: Secondary | ICD-10-CM

## 2023-05-05 DIAGNOSIS — E349 Endocrine disorder, unspecified: Secondary | ICD-10-CM | POA: Diagnosis not present

## 2023-07-18 IMAGING — CT CT CHEST LUNG CANCER SCREENING LOW DOSE W/O CM
1 series · 10 of 10 positions shown, 13 images · non-contrast
Comparison: None.

CLINICAL DATA: Lung cancer screening. Current asymptomatic smoker
with 63 pack-year history.



[ct lung segmentation data · axial · 0.82mm/px · z∈[-398,-398]mm · 10 of 349 frames shown]
[frame 1/349  mediastinal]
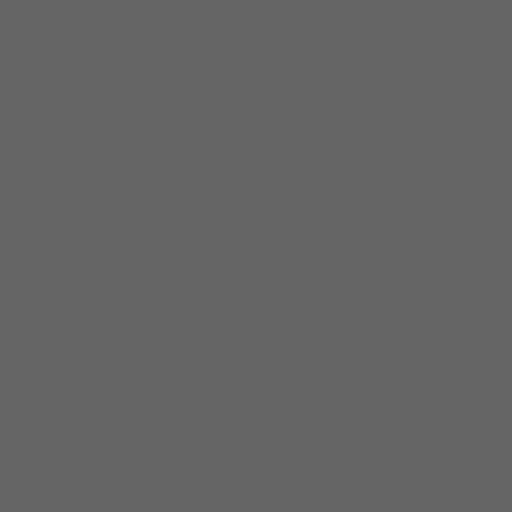
[frame 1/349  lung]
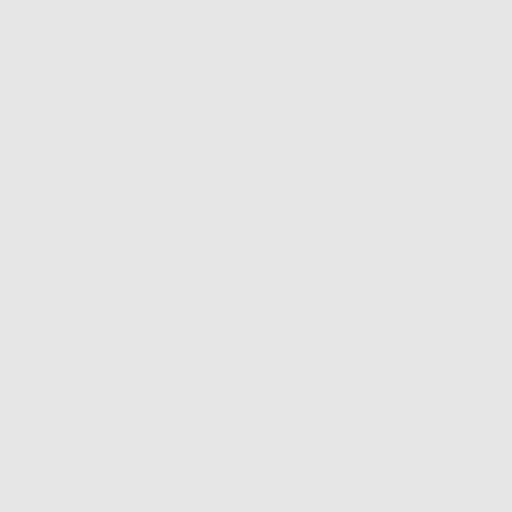
[frame 39/349  lung]
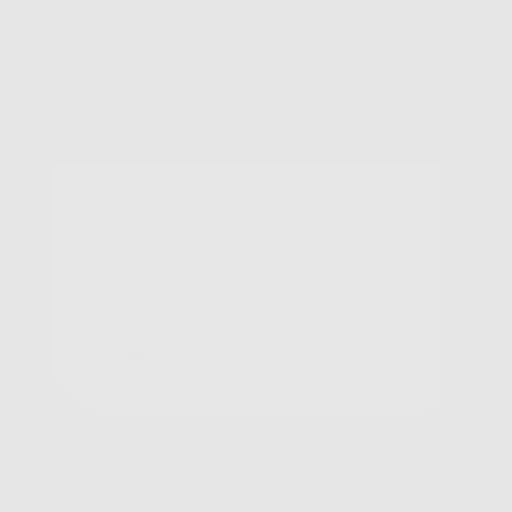
[frame 78/349  lung]
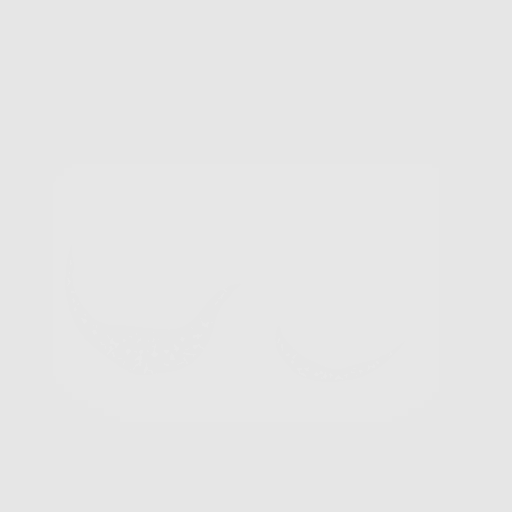
[frame 117/349  lung]
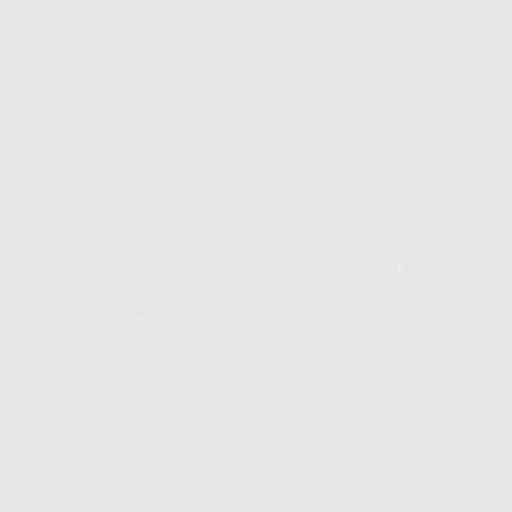
[frame 155/349  mediastinal]
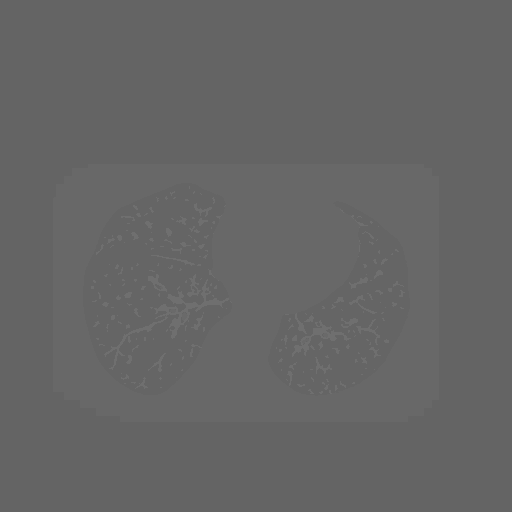
[frame 155/349  lung]
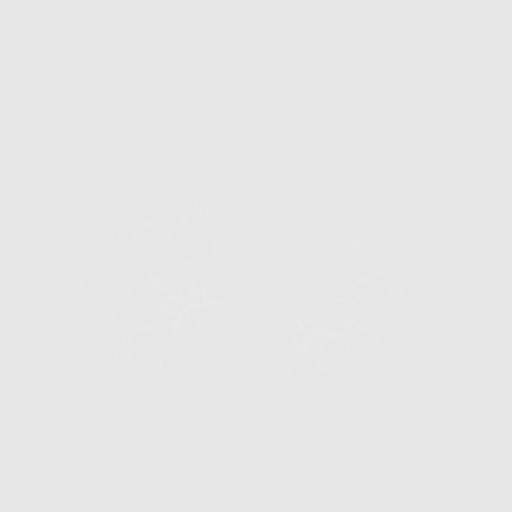
[frame 194/349  lung]
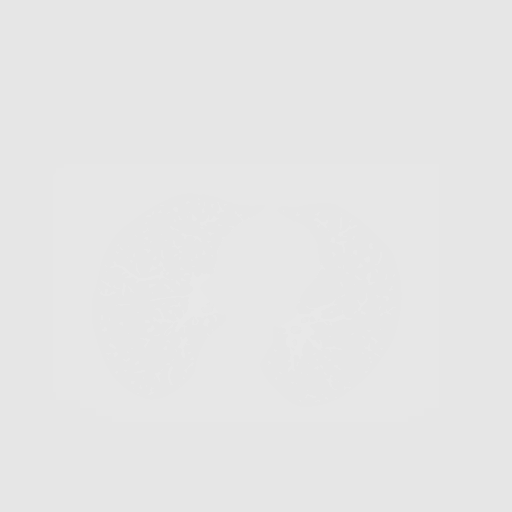
[frame 233/349  lung]
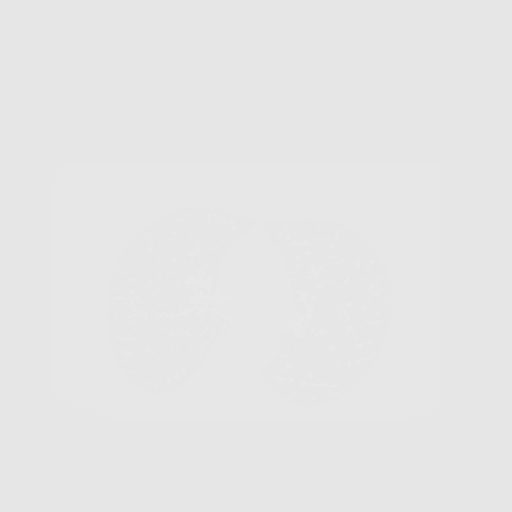
[frame 271/349  lung]
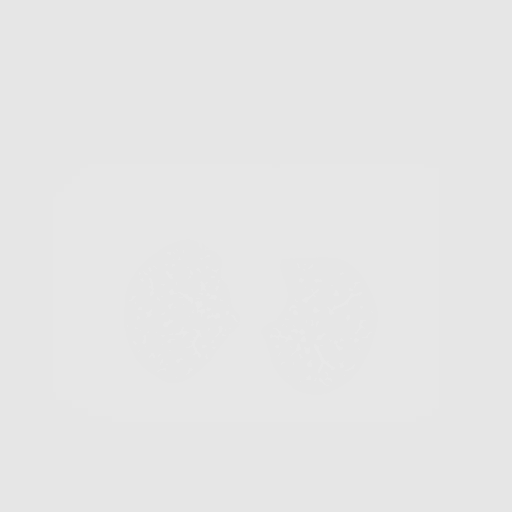
[frame 310/349  mediastinal]
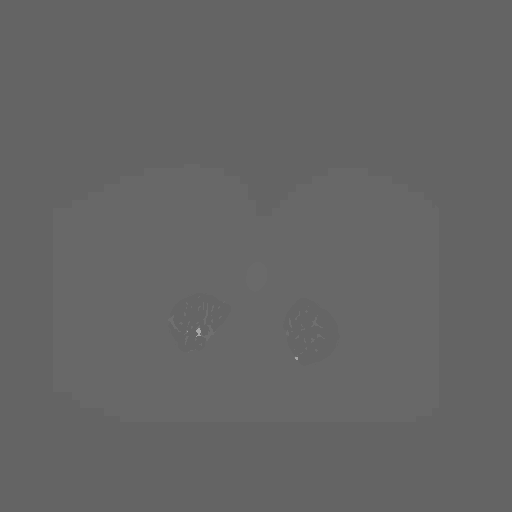
[frame 310/349  lung]
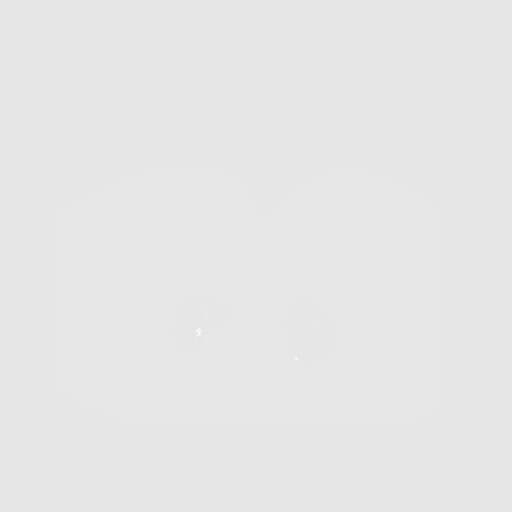
[frame 349/349  lung]
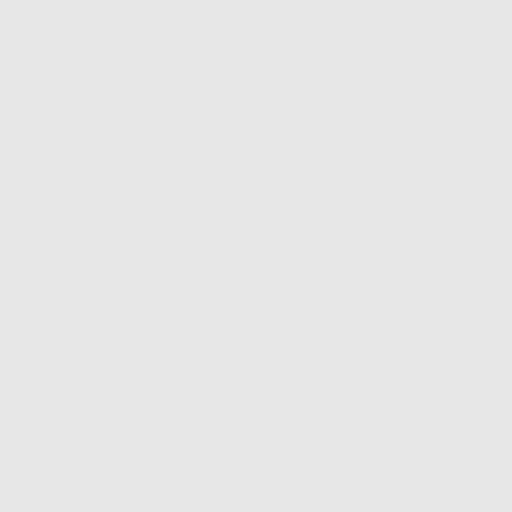

[10 of 10 positions shown; findings below may reference images not displayed]

FINDINGS: Cardiovascular: The heart size appears within normal limits. No
pericardial effusion. Aortic atherosclerosis.

Mediastinum/Nodes: No enlarged mediastinal, hilar, or axillary lymph
nodes. Thyroid gland, trachea, and esophagus demonstrate no
significant findings.

Lungs/Pleura: There is no pleural effusion, airspace consolidation,
or atelectasis. There are small scattered lung nodules. The largest
nodule is in the right upper lobe with a mean derived diameter of
5.4 mm. No suspicious lung nodules identified at this time.

Upper Abdomen: No acute abnormality.  Status post cholecystectomy.

Musculoskeletal: No chest wall mass or suspicious bone lesions
identified. Curvature of the thoracic spine is convex towards the
right.
IMPRESSION: 1. Lung-RADS 2, benign appearance or behavior. Continue annual
screening with low-dose chest CT without contrast in 12 months.
2. Aortic Atherosclerosis ([id number]).

## 2023-09-06 ENCOUNTER — Encounter: Payer: Self-pay | Admitting: Gastroenterology

## 2023-10-08 DIAGNOSIS — H524 Presbyopia: Secondary | ICD-10-CM | POA: Diagnosis not present

## 2023-11-08 ENCOUNTER — Ambulatory Visit (AMBULATORY_SURGERY_CENTER): Payer: Medicare Other | Admitting: *Deleted

## 2023-11-08 VITALS — Ht 65.0 in | Wt 130.0 lb

## 2023-11-08 DIAGNOSIS — Z860100 Personal history of colon polyps, unspecified: Secondary | ICD-10-CM

## 2023-11-08 DIAGNOSIS — F17200 Nicotine dependence, unspecified, uncomplicated: Secondary | ICD-10-CM | POA: Insufficient documentation

## 2023-11-08 MED ORDER — NA SULFATE-K SULFATE-MG SULF 17.5-3.13-1.6 GM/177ML PO SOLN: 1.0000 | Freq: Once | ORAL | 0 refills | Status: AC

## 2023-11-08 NOTE — Progress Notes (Signed)
Pre visit completed over telephone. Instructions mailed.    No egg or soy allergy known to patient  No issues known to pt with past sedation with any surgeries or procedures Patient denies ever being told they had issues or difficulty with intubation  No FH of Malignant Hyperthermia Pt is not on diet pills Pt is not on  home 02  Pt is not on blood thinners  Pt denies issues with constipation  No A fib or A flutter Have any cardiac testing pending--NO Pt instructed to use [email or web address] or GoodRx for a price reduction on prep

## 2023-11-10 DIAGNOSIS — Z1159 Encounter for screening for other viral diseases: Secondary | ICD-10-CM | POA: Diagnosis not present

## 2023-11-10 DIAGNOSIS — Z0000 Encounter for general adult medical examination without abnormal findings: Secondary | ICD-10-CM | POA: Diagnosis not present

## 2023-11-10 DIAGNOSIS — R911 Solitary pulmonary nodule: Secondary | ICD-10-CM | POA: Diagnosis not present

## 2023-11-10 DIAGNOSIS — F17200 Nicotine dependence, unspecified, uncomplicated: Secondary | ICD-10-CM | POA: Diagnosis not present

## 2023-11-10 DIAGNOSIS — R634 Abnormal weight loss: Secondary | ICD-10-CM | POA: Diagnosis not present

## 2023-11-10 DIAGNOSIS — E559 Vitamin D deficiency, unspecified: Secondary | ICD-10-CM | POA: Diagnosis not present

## 2023-11-10 DIAGNOSIS — G4700 Insomnia, unspecified: Secondary | ICD-10-CM | POA: Diagnosis not present

## 2023-11-10 DIAGNOSIS — I700 Atherosclerosis of aorta: Secondary | ICD-10-CM | POA: Diagnosis not present

## 2023-11-10 DIAGNOSIS — E785 Hyperlipidemia, unspecified: Secondary | ICD-10-CM | POA: Diagnosis not present

## 2023-12-02 ENCOUNTER — Encounter: Payer: Self-pay | Admitting: Certified Registered Nurse Anesthetist

## 2023-12-03 ENCOUNTER — Encounter: Payer: Self-pay | Admitting: Gastroenterology

## 2023-12-06 ENCOUNTER — Encounter: Payer: Self-pay | Admitting: Gastroenterology

## 2023-12-06 ENCOUNTER — Ambulatory Visit (AMBULATORY_SURGERY_CENTER): Payer: Medicare HMO | Admitting: Gastroenterology

## 2023-12-06 VITALS — BP 104/64 | HR 63 | Temp 97.9°F | Resp 11 | Ht 65.0 in | Wt 130.0 lb

## 2023-12-06 DIAGNOSIS — D125 Benign neoplasm of sigmoid colon: Secondary | ICD-10-CM | POA: Diagnosis not present

## 2023-12-06 DIAGNOSIS — D122 Benign neoplasm of ascending colon: Secondary | ICD-10-CM | POA: Diagnosis not present

## 2023-12-06 DIAGNOSIS — E785 Hyperlipidemia, unspecified: Secondary | ICD-10-CM | POA: Diagnosis not present

## 2023-12-06 DIAGNOSIS — K5730 Diverticulosis of large intestine without perforation or abscess without bleeding: Secondary | ICD-10-CM

## 2023-12-06 DIAGNOSIS — Z860101 Personal history of adenomatous and serrated colon polyps: Secondary | ICD-10-CM

## 2023-12-06 DIAGNOSIS — K6289 Other specified diseases of anus and rectum: Secondary | ICD-10-CM

## 2023-12-06 DIAGNOSIS — F32A Depression, unspecified: Secondary | ICD-10-CM | POA: Diagnosis not present

## 2023-12-06 DIAGNOSIS — Z1211 Encounter for screening for malignant neoplasm of colon: Secondary | ICD-10-CM

## 2023-12-06 DIAGNOSIS — Z860100 Personal history of colon polyps, unspecified: Secondary | ICD-10-CM

## 2023-12-06 MED ORDER — SODIUM CHLORIDE 0.9 % IV SOLN: 500.0000 mL | Freq: Once | INTRAVENOUS | Status: DC

## 2023-12-06 NOTE — Progress Notes (Signed)
 Report given to PACU, vss

## 2023-12-06 NOTE — Progress Notes (Signed)
 [facility] History and Physical   Primary Care Physician:  Kip Righter, MD   Reason for Procedure:   History of colon polyps  Plan:    colonoscopy     HPI: [name] is a 68 y.o. female  here for colonoscopy surveillance - adenoma removed on last exam 11/2016.   Patient denies any bowel symptoms at this time. No family history of colon cancer known. Otherwise feels well without any cardiopulmonary symptoms.   I have discussed risks / benefits of anesthesia and endoscopic procedure with [name] and they wish to proceed with the exams as outlined today.    Past Medical History:  Diagnosis Date   Abnormal EKG    Brain cyst    Chest pain    Depression    Hyperlipidemia    Insomnia     Past Surgical History:  Procedure Laterality Date   BRAIN SURGERY     removed cyst   CHOLECYSTECTOMY     COLONOSCOPY      Prior to Admission medications   Medication Sig Start Date End Date Taking? Authorizing Provider  Lactobacillus Rhamnosus, GG, (CULTURELLE PO) Take by mouth daily.   Yes [provider]  Multiple Vitamins-Minerals (MULTIVITAMIN ADULT PO) Take by mouth daily.   Yes [provider]  simvastatin (ZOCOR) 20 MG tablet SMARTSIG:1 Tablet(s) By Mouth Every Evening 12/05/21  Yes [provider]  venlafaxine XR (EFFEXOR-XR) 150 MG 24 hr capsule Take 150 mg by mouth daily. 12/26/21  Yes [provider]  gabapentin (NEURONTIN) 300 MG capsule SMARTSIG:1 Capsule(s) By Mouth Every Evening PRN Patient not taking: Reported on 11/08/2023 11/04/21   [provider]  meloxicam  (MOBIC ) 15 MG tablet Take 1 tablet (15 mg total) by mouth daily. Patient not taking: Reported on 12/06/2023 06/15/22   Persons, [name], [name]    Current Outpatient Medications  Medication Sig Dispense Refill   Lactobacillus Rhamnosus, GG, (CULTURELLE PO) Take by mouth daily.     Multiple Vitamins-Minerals (MULTIVITAMIN ADULT PO) Take by mouth daily.      simvastatin (ZOCOR) 20 MG tablet SMARTSIG:1 Tablet(s) By Mouth Every Evening     venlafaxine XR (EFFEXOR-XR) 150 MG 24 hr capsule Take 150 mg by mouth daily.     gabapentin (NEURONTIN) 300 MG capsule SMARTSIG:1 Capsule(s) By Mouth Every Evening PRN (Patient not taking: Reported on 11/08/2023)     meloxicam  (MOBIC ) 15 MG tablet Take 1 tablet (15 mg total) by mouth daily. (Patient not taking: Reported on 12/06/2023) 30 tablet 0   Current Facility-Administered Medications  Medication Dose Route Frequency Provider Last Rate Last Admin   0.9 %  sodium chloride  infusion  500 mL Intravenous Once Marca Gadsby, Elspeth SQUIBB, MD        Allergies as of 12/06/2023 - Review Complete 12/06/2023  Allergen Reaction Noted   Suvorexant Other (See Comments) 10/23/2020    Family History  Problem Relation Age of Onset   Cancer Mother        LIVER   Gout Father    Other Father        PNUEMONIA   Hypertension Father    CAD Father    Heart failure Father    Diabetes Father    Obesity Sister    Depression Sister    Other Sister        BACK PROBLEMS   Cancer - Prostate Brother    Diabetes Brother    Cancer - Other Brother  SKIN   Breast cancer Maternal Aunt    Colon cancer Neg Hx    Esophageal cancer Neg Hx    Rectal cancer Neg Hx    Stomach cancer Neg Hx    Colon polyps Neg Hx    Cancer - Colon Neg Hx     Social History   Socioeconomic History   Marital status: Married    Spouse name: Not on file   Number of children: Not on file   Years of education: Not on file   Highest education level: Not on file  Occupational History   Not on file  Tobacco Use   Smoking status: Every Day    Current packs/day: 0.00    Average packs/day: 1.8 packs/day for 36.0 years (63.0 ttl pk-yrs)    Types: Cigarettes    Start date: 02/16/1968    Last attempt to quit: 02/16/2004    Years since quitting: 19.8   Smokeless tobacco: Never  Vaping Use   Vaping status: Never Used  Substance and Sexual Activity    Alcohol use: No   Drug use: No   Sexual activity: Not on file  Other Topics Concern   Not on file  Social History Narrative   Not on file   Social Drivers of Health   Financial Resource Strain: Not on file  Food Insecurity: Not on file  Transportation Needs: Not on file  Physical Activity: Not on file  Stress: Not on file  Social Connections: Not on file  Intimate Partner Violence: Not on file    Review of Systems: All other review of systems negative except as mentioned in the HPI.  Physical Exam: Vital signs BP 112/72   Pulse 88   Temp 97.9 F (36.6 C) (Temporal)   Ht 5' 5 (1.651 m)   Wt 130 lb (59 kg)   SpO2 98%   BMI 21.63 kg/m   General:   Alert,  Well-developed, pleasant and cooperative in NAD Lungs:  Clear throughout to auscultation.   Heart:  Regular rate and rhythm Abdomen:  Soft, nontender and nondistended.   Neuro/Psych:  Alert and cooperative. Normal mood and affect. A and O x 3  Marcey Naval, MD [facility]

## 2023-12-06 NOTE — Progress Notes (Signed)
 Called to room to assist during endoscopic procedure.  Patient ID and intended procedure confirmed with present staff. Received instructions for my participation in the procedure from the performing physician.

## 2023-12-06 NOTE — Patient Instructions (Addendum)

## 2023-12-06 NOTE — Op Note (Signed)
 [facility] Patient Name: [name] Procedure Date: 12/06/2023 8:24 AM MRN: [id number] Endoscopist: Kimberly P. Leigh , MD, [id number] Age: 68 Referring MD:  Date of Birth: Apr 16, 1956 Gender: Female Account #: [id number] Procedure:                Colonoscopy Indications:              High risk colon cancer surveillance: Personal                            history of colonic polyps - adenoma removed on last                            exam 11/2016 Medicines:                Monitored Anesthesia Care Procedure:                Pre-Anesthesia Assessment:                           - Prior to the procedure, a History and Physical                            was performed, and patient medications and                            allergies were reviewed. The patient's tolerance of                            previous anesthesia was also reviewed. The risks                            and benefits of the procedure and the sedation                            options and risks were discussed with the patient.                            All questions were answered, and informed consent                            was obtained. Prior Anticoagulants: The patient has                            taken no anticoagulant or antiplatelet agents. ASA                            Grade Assessment: II - A patient with mild systemic                            disease. After reviewing the risks and benefits,                            the patient was deemed in satisfactory condition to  undergo the procedure.                           After obtaining informed consent, the colonoscope                            was passed under direct vision. Throughout the                            procedure, the patient's blood pressure, pulse, and                            oxygen saturations were monitored continuously. The                            Olympus Scope [id number] was introduced  through the                            anus and advanced to the the cecum, identified by                            appendiceal orifice and ileocecal valve. The                            colonoscopy was performed without difficulty. The                            patient tolerated the procedure well. The quality                            of the bowel preparation was good. The ileocecal                            valve, appendiceal orifice, and rectum were                            photographed. Scope In: 8:33:11 AM Scope Out: 8:48:37 AM Scope Withdrawal Time: 0 hours 12 minutes 9 seconds  Total Procedure Duration: 0 hours 15 minutes 26 seconds  Findings:                 The perianal and digital rectal examinations were                            normal.                           A diminutive polyp was found in the ascending                            colon. The polyp was sessile. The polyp was removed                            with a cold snare. Resection and retrieval were  complete.                           A 3 mm polyp was found in the sigmoid colon. The                            polyp was sessile. The polyp was removed with a                            cold snare. Resection was complete, but the polyp                            tissue was not retrieved.                           A few small-mouthed diverticula were found in the                            left colon and right colon.                           Anal papilla(e) were hypertrophied.                           The exam was otherwise without abnormality. Complications:            No immediate complications. Estimated blood loss:                            Minimal. Estimated Blood Loss:     Estimated blood loss was minimal. Impression:               - One diminutive polyp in the ascending colon,                            removed with a cold snare. Resected and retrieved.                            - One 3 mm polyp in the sigmoid colon, removed with                            a cold snare. Complete resection. Polyp tissue not                            retrieved.                           - Diverticulosis in the left colon and in the right                            colon.                           - Anal papilla(e) were hypertrophied.                           -  The examination was otherwise normal. Recommendation:           - Patient has a contact number available for                            emergencies. The signs and symptoms of potential                            delayed complications were discussed with the                            patient. Return to normal activities tomorrow.                            Written discharge instructions were provided to the                            patient.                           - Resume previous diet.                           - Continue present medications.                           - Await pathology results. Kimberly P. Riyan Gavina, MD 12/06/2023 8:52:42 AM This report has been signed electronically.

## 2023-12-06 NOTE — Progress Notes (Signed)
 Pt's states no medical or surgical changes since previsit or office visit.

## 2023-12-07 ENCOUNTER — Telehealth: Payer: Self-pay | Admitting: *Deleted

## 2023-12-07 NOTE — Telephone Encounter (Signed)
 Post procedure follow up call attempted.  No answer and unable to LVM.

## 2023-12-08 LAB — SURGICAL PATHOLOGY

## 2023-12-09 ENCOUNTER — Encounter: Payer: Self-pay | Admitting: Gastroenterology

## 2024-02-04 ENCOUNTER — Other Ambulatory Visit: Payer: Self-pay

## 2024-02-04 DIAGNOSIS — Z87891 Personal history of nicotine dependence: Secondary | ICD-10-CM

## 2024-02-04 DIAGNOSIS — Z122 Encounter for screening for malignant neoplasm of respiratory organs: Secondary | ICD-10-CM

## 2024-02-04 DIAGNOSIS — F17210 Nicotine dependence, cigarettes, uncomplicated: Secondary | ICD-10-CM

## 2024-02-17 ENCOUNTER — Ambulatory Visit
Admission: RE | Admit: 2024-02-17 | Discharge: 2024-02-17 | Disposition: A | Discharge status: Home / Self Care | Source: Ambulatory Visit | Attending: Family Medicine | Admitting: Family Medicine

## 2024-02-17 DIAGNOSIS — F17210 Nicotine dependence, cigarettes, uncomplicated: Secondary | ICD-10-CM

## 2024-02-17 DIAGNOSIS — Z122 Encounter for screening for malignant neoplasm of respiratory organs: Secondary | ICD-10-CM | POA: Diagnosis not present

## 2024-02-17 DIAGNOSIS — Z0100 Encounter for examination of eyes and vision without abnormal findings: Secondary | ICD-10-CM | POA: Diagnosis not present

## 2024-02-17 DIAGNOSIS — Z87891 Personal history of nicotine dependence: Secondary | ICD-10-CM

## 2024-03-14 ENCOUNTER — Other Ambulatory Visit: Payer: Self-pay

## 2024-03-14 DIAGNOSIS — Z87891 Personal history of nicotine dependence: Secondary | ICD-10-CM

## 2024-03-14 DIAGNOSIS — Z122 Encounter for screening for malignant neoplasm of respiratory organs: Secondary | ICD-10-CM

## 2024-03-14 DIAGNOSIS — F17210 Nicotine dependence, cigarettes, uncomplicated: Secondary | ICD-10-CM

## 2024-05-25 ENCOUNTER — Other Ambulatory Visit: Payer: Self-pay

## 2024-05-25 ENCOUNTER — Emergency Department (HOSPITAL_COMMUNITY)

## 2024-05-25 ENCOUNTER — Inpatient Hospital Stay (HOSPITAL_COMMUNITY)
Admission: EM | Admit: 2024-05-25 | Discharge: 2024-05-26 | DRG: 871 | Discharge status: Against Medical Advice | Attending: Internal Medicine | Admitting: Internal Medicine

## 2024-05-25 ENCOUNTER — Encounter (HOSPITAL_COMMUNITY): Payer: Self-pay

## 2024-05-25 DIAGNOSIS — Z800 Family history of malignant neoplasm of digestive organs: Secondary | ICD-10-CM | POA: Diagnosis not present

## 2024-05-25 DIAGNOSIS — R7989 Other specified abnormal findings of blood chemistry: Secondary | ICD-10-CM | POA: Diagnosis not present

## 2024-05-25 DIAGNOSIS — E861 Hypovolemia: Secondary | ICD-10-CM | POA: Diagnosis not present

## 2024-05-25 DIAGNOSIS — Z833 Family history of diabetes mellitus: Secondary | ICD-10-CM

## 2024-05-25 DIAGNOSIS — F17210 Nicotine dependence, cigarettes, uncomplicated: Secondary | ICD-10-CM | POA: Diagnosis present

## 2024-05-25 DIAGNOSIS — A419 Sepsis, unspecified organism: Principal | ICD-10-CM | POA: Diagnosis present

## 2024-05-25 DIAGNOSIS — Z8249 Family history of ischemic heart disease and other diseases of the circulatory system: Secondary | ICD-10-CM | POA: Diagnosis not present

## 2024-05-25 DIAGNOSIS — Z803 Family history of malignant neoplasm of breast: Secondary | ICD-10-CM

## 2024-05-25 DIAGNOSIS — J439 Emphysema, unspecified: Secondary | ICD-10-CM | POA: Diagnosis present

## 2024-05-25 DIAGNOSIS — D649 Anemia, unspecified: Secondary | ICD-10-CM | POA: Diagnosis not present

## 2024-05-25 DIAGNOSIS — E876 Hypokalemia: Secondary | ICD-10-CM | POA: Diagnosis present

## 2024-05-25 DIAGNOSIS — Z79899 Other long term (current) drug therapy: Secondary | ICD-10-CM | POA: Diagnosis not present

## 2024-05-25 DIAGNOSIS — M4802 Spinal stenosis, cervical region: Secondary | ICD-10-CM | POA: Diagnosis not present

## 2024-05-25 DIAGNOSIS — Z1152 Encounter for screening for COVID-19: Secondary | ICD-10-CM

## 2024-05-25 DIAGNOSIS — M47812 Spondylosis without myelopathy or radiculopathy, cervical region: Secondary | ICD-10-CM | POA: Diagnosis not present

## 2024-05-25 DIAGNOSIS — Z8269 Family history of other diseases of the musculoskeletal system and connective tissue: Secondary | ICD-10-CM

## 2024-05-25 DIAGNOSIS — E785 Hyperlipidemia, unspecified: Secondary | ICD-10-CM | POA: Diagnosis not present

## 2024-05-25 DIAGNOSIS — Z716 Tobacco abuse counseling: Secondary | ICD-10-CM

## 2024-05-25 DIAGNOSIS — Z888 Allergy status to other drugs, medicaments and biological substances status: Secondary | ICD-10-CM | POA: Diagnosis not present

## 2024-05-25 DIAGNOSIS — J189 Pneumonia, unspecified organism: Principal | ICD-10-CM | POA: Diagnosis present

## 2024-05-25 DIAGNOSIS — W19XXXA Unspecified fall, initial encounter: Secondary | ICD-10-CM | POA: Diagnosis not present

## 2024-05-25 DIAGNOSIS — E8809 Other disorders of plasma-protein metabolism, not elsewhere classified: Secondary | ICD-10-CM | POA: Diagnosis present

## 2024-05-25 DIAGNOSIS — R6521 Severe sepsis with septic shock: Secondary | ICD-10-CM | POA: Diagnosis not present

## 2024-05-25 DIAGNOSIS — Z791 Long term (current) use of non-steroidal anti-inflammatories (NSAID): Secondary | ICD-10-CM | POA: Diagnosis not present

## 2024-05-25 DIAGNOSIS — Z5329 Procedure and treatment not carried out because of patient's decision for other reasons: Secondary | ICD-10-CM | POA: Diagnosis not present

## 2024-05-25 DIAGNOSIS — R918 Other nonspecific abnormal finding of lung field: Secondary | ICD-10-CM | POA: Diagnosis not present

## 2024-05-25 DIAGNOSIS — Z818 Family history of other mental and behavioral disorders: Secondary | ICD-10-CM | POA: Diagnosis not present

## 2024-05-25 DIAGNOSIS — R059 Cough, unspecified: Secondary | ICD-10-CM | POA: Diagnosis not present

## 2024-05-25 DIAGNOSIS — M5030 Other cervical disc degeneration, unspecified cervical region: Secondary | ICD-10-CM | POA: Diagnosis not present

## 2024-05-25 DIAGNOSIS — M542 Cervicalgia: Secondary | ICD-10-CM | POA: Diagnosis not present

## 2024-05-25 DIAGNOSIS — G9389 Other specified disorders of brain: Secondary | ICD-10-CM | POA: Diagnosis not present

## 2024-05-25 DIAGNOSIS — R55 Syncope and collapse: Secondary | ICD-10-CM | POA: Diagnosis not present

## 2024-05-25 DIAGNOSIS — R6883 Chills (without fever): Secondary | ICD-10-CM | POA: Diagnosis not present

## 2024-05-25 DIAGNOSIS — Z808 Family history of malignant neoplasm of other organs or systems: Secondary | ICD-10-CM

## 2024-05-25 DIAGNOSIS — S0990XA Unspecified injury of head, initial encounter: Secondary | ICD-10-CM | POA: Diagnosis not present

## 2024-05-25 DIAGNOSIS — S199XXA Unspecified injury of neck, initial encounter: Secondary | ICD-10-CM | POA: Diagnosis not present

## 2024-05-25 DIAGNOSIS — I10 Essential (primary) hypertension: Secondary | ICD-10-CM | POA: Diagnosis not present

## 2024-05-25 LAB — I-STAT CHEM 8, ED
BUN: 9 mg/dL (ref 8–23)
Calcium, Ion: 1.05 mmol/L — ABNORMAL LOW (ref 1.15–1.40)
Chloride: 97 mmol/L — ABNORMAL LOW (ref 98–111)
Creatinine, Ser: 0.6 mg/dL (ref 0.44–1.00)
Glucose, Bld: 125 mg/dL — ABNORMAL HIGH (ref 70–99)
HCT: 35 % — ABNORMAL LOW (ref 36.0–46.0)
Hemoglobin: 11.9 g/dL — ABNORMAL LOW (ref 12.0–15.0)
Potassium: 3.5 mmol/L (ref 3.5–5.1)
Sodium: 135 mmol/L (ref 135–145)
TCO2: 25 mmol/L (ref 22–32)

## 2024-05-25 LAB — TROPONIN I (HIGH SENSITIVITY)
Troponin I (High Sensitivity): 8 ng/L (ref <18)
Troponin I (High Sensitivity): 11 ng/L (ref <18)

## 2024-05-25 LAB — MAGNESIUM: Magnesium: 1.5 mg/dL — ABNORMAL LOW (ref 1.7–2.4)

## 2024-05-25 LAB — CBC WITH DIFFERENTIAL/PLATELET
Abs Immature Granulocytes: 0.17 10*3/uL — ABNORMAL HIGH (ref 0.00–0.07)
Basophils Absolute: 0 10*3/uL (ref 0.0–0.1)
Basophils Relative: 0 %
Eosinophils Absolute: 0.1 10*3/uL (ref 0.0–0.5)
Eosinophils Relative: 1 %
HCT: 36.1 % (ref 36.0–46.0)
Hemoglobin: 12.4 g/dL (ref 12.0–15.0)
Immature Granulocytes: 1 %
Lymphocytes Relative: 4 %
Lymphs Abs: 0.7 10*3/uL (ref 0.7–4.0)
MCH: 31.3 pg (ref 26.0–34.0)
MCHC: 34.3 g/dL (ref 30.0–36.0)
MCV: 91.2 fL (ref 80.0–100.0)
Monocytes Absolute: 1.4 10*3/uL — ABNORMAL HIGH (ref 0.1–1.0)
Monocytes Relative: 8 %
Neutro Abs: 15.8 10*3/uL — ABNORMAL HIGH (ref 1.7–7.7)
Neutrophils Relative %: 86 %
Platelets: 203 10*3/uL (ref 150–400)
RBC: 3.96 MIL/uL (ref 3.87–5.11)
RDW: 11.7 % (ref 11.5–15.5)
WBC: 18.2 10*3/uL — ABNORMAL HIGH (ref 4.0–10.5)
nRBC: 0 % (ref 0.0–0.2)

## 2024-05-25 LAB — URINALYSIS, W/ REFLEX TO CULTURE (INFECTION SUSPECTED)
Bacteria, UA: NONE SEEN
Bilirubin Urine: NEGATIVE
Glucose, UA: NEGATIVE mg/dL
Hgb urine dipstick: NEGATIVE
Ketones, ur: NEGATIVE mg/dL
Leukocytes,Ua: NEGATIVE
Nitrite: NEGATIVE
Protein, ur: NEGATIVE mg/dL
Specific Gravity, Urine: 1.005 (ref 1.005–1.030)
pH: 6 (ref 5.0–8.0)

## 2024-05-25 LAB — COMPREHENSIVE METABOLIC PANEL WITH GFR
ALT: 51 U/L — ABNORMAL HIGH (ref 0–44)
AST: 76 U/L — ABNORMAL HIGH (ref 15–41)
Albumin: 3.1 g/dL — ABNORMAL LOW (ref 3.5–5.0)
Alkaline Phosphatase: 83 U/L (ref 38–126)
Anion gap: 9 (ref 5–15)
BUN: 9 mg/dL (ref 8–23)
CO2: 26 mmol/L (ref 22–32)
Calcium: 8.4 mg/dL — ABNORMAL LOW (ref 8.9–10.3)
Chloride: 99 mmol/L (ref 98–111)
Creatinine, Ser: 0.78 mg/dL (ref 0.44–1.00)
GFR, Estimated: >60 mL/min (ref >60)
Glucose, Bld: 132 mg/dL — ABNORMAL HIGH (ref 70–99)
Potassium: 3.6 mmol/L (ref 3.5–5.1)
Sodium: 134 mmol/L — ABNORMAL LOW (ref 135–145)
Total Bilirubin: 1.5 mg/dL — ABNORMAL HIGH (ref 0.0–1.2)
Total Protein: 5.8 g/dL — ABNORMAL LOW (ref 6.5–8.1)

## 2024-05-25 LAB — RESP PANEL BY RT-PCR (RSV, FLU A&B, COVID)  RVPGX2
Influenza A by PCR: NEGATIVE
Influenza B by PCR: NEGATIVE
Resp Syncytial Virus by PCR: NEGATIVE
SARS Coronavirus 2 by RT PCR: NEGATIVE

## 2024-05-25 LAB — PROTIME-INR
INR: 1.1 (ref 0.8–1.2)
Prothrombin Time: 14.4 s (ref 11.4–15.2)

## 2024-05-25 LAB — D-DIMER, QUANTITATIVE: D-Dimer, Quant: 0.77 ug{FEU}/mL — ABNORMAL HIGH (ref 0.00–0.50)

## 2024-05-25 LAB — I-STAT CG4 LACTIC ACID, ED
Lactic Acid, Venous: 1.4 mmol/L (ref 0.5–1.9)
Lactic Acid, Venous: 0.7 mmol/L (ref 0.5–1.9)

## 2024-05-25 LAB — HIV ANTIBODY (ROUTINE TESTING W REFLEX): HIV Screen 4th Generation wRfx: NONREACTIVE

## 2024-05-25 MED ORDER — PANTOPRAZOLE SODIUM 40 MG IV SOLR
40.0000 mg | Freq: Two times a day (BID) | INTRAVENOUS | Status: DC
  Administered 2024-05-25 – 2024-05-26 (×3): 40 mg via INTRAVENOUS
  Filled 2024-05-25 (×3): qty 10

## 2024-05-25 MED ORDER — SODIUM CHLORIDE 0.9 % IV SOLN
2.0000 g | Freq: Once | INTRAVENOUS | Status: AC
  Administered 2024-05-25: 2 g via INTRAVENOUS
  Filled 2024-05-25: qty 20

## 2024-05-25 MED ORDER — SODIUM CHLORIDE 0.9 % IV SOLN
500.0000 mg | Freq: Once | INTRAVENOUS | Status: AC
  Administered 2024-05-25: 500 mg via INTRAVENOUS
  Filled 2024-05-25: qty 5

## 2024-05-25 MED ORDER — HEPARIN SODIUM (PORCINE) 5000 UNIT/ML IJ SOLN
5000.0000 [IU] | Freq: Three times a day (TID) | INTRAMUSCULAR | Status: DC
  Administered 2024-05-25 – 2024-05-26 (×3): 5000 [IU] via SUBCUTANEOUS
  Filled 2024-05-25 (×3): qty 1

## 2024-05-25 MED ORDER — SODIUM CHLORIDE 0.9 % IV SOLN
500.0000 mg | INTRAVENOUS | Status: DC
  Administered 2024-05-26: 500 mg via INTRAVENOUS
  Filled 2024-05-25: qty 5

## 2024-05-25 MED ORDER — LACTATED RINGERS IV SOLN
INTRAVENOUS | Status: DC
  Administered 2024-05-25: 75 mL/h via INTRAVENOUS

## 2024-05-25 MED ORDER — ACETAMINOPHEN 325 MG PO TABS
650.0000 mg | ORAL_TABLET | Freq: Four times a day (QID) | ORAL | Status: DC | PRN
  Administered 2024-05-25: 650 mg via ORAL
  Filled 2024-05-25: qty 2

## 2024-05-25 MED ORDER — SODIUM CHLORIDE 0.9% FLUSH
3.0000 mL | Freq: Two times a day (BID) | INTRAVENOUS | Status: DC
  Administered 2024-05-25: 3 mL via INTRAVENOUS

## 2024-05-25 MED ORDER — SODIUM CHLORIDE 0.9 % IV SOLN
2.0000 g | INTRAVENOUS | Status: DC
  Administered 2024-05-26: 2 g via INTRAVENOUS
  Filled 2024-05-25: qty 20

## 2024-05-25 MED ORDER — LACTATED RINGERS IV BOLUS
1000.0000 mL | Freq: Once | INTRAVENOUS | Status: AC
  Administered 2024-05-25: 1000 mL via INTRAVENOUS

## 2024-05-25 MED ORDER — IOHEXOL 350 MG/ML SOLN
75.0000 mL | Freq: Once | INTRAVENOUS | Status: AC | PRN
  Administered 2024-05-25: 75 mL via INTRAVENOUS

## 2024-05-25 NOTE — H&P (Signed)
 History and Physical    Patient: [name] FMW:[id number] DOB: July 09, 1956 DOA: 05/25/2024 DOS: the patient was seen and examined on 05/25/2024 . PCP: Kip Righter, MD  Patient coming from: Home Chief complaint: Chief Complaint  Patient presents with   Loss of Consciousness   HPI:  [name] is a 68 y.o. female with past medical history  of tobacco abuse/emphysema/depression/hyperlipidemia/allergy to suvorexant/history of brain surgery for cyst and cholecystectomy presenting today with a syncope episode with collapse and loss of consciousness.  Her family found her on the floor, daughter heard her fall to the floor, patient initially was oriented lethargic initial EMS blood pressure was systolic of 80s, she was given 1 L of LR.  Patient did report chills over the past 3 days last known normal was 8 PM. Chart review shows patient is followed by cardiology, orthopedics, pulmonology and her last visit with GI was in January 2025 for colonoscopy surveillance patient had adenoma removed in 2018.  Patient had echocardiogram in 2022 showing EF of 60 to 65% normal pulmonary artery systolic pressures aortic valve is not grossly normal with mild thickening, left ventricular diastolic parameters were normal.  ED Course: Pt in ed at bedside  is stable oriented temperature 99.4. 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 Patient also had a CT angio PE protocol which showed new consolidation suspicious for pneumonia in the lingula, negative for pulmonary embolism, please see complete report below. CMP showed glucose of 132 sodium 134 albumin 3.1 abnormal LFTs with AST of 76 and ALT of 51 total bili at 1.5 normal kidney function. Troponin is 8, repeat troponin of 11. Lactic acid of 1.4. CBC shows leukocytosis of 18.2 hemoglobin of 12.4 platelets 203 normal differential. Urinalysis today is within normal limits.  >>While in the ED patient received the following: Medications  cefTRIAXone (ROCEPHIN) 2 g in sodium chloride  0.9 % 100 mL IVPB (0 g Intravenous Stopped 05/25/24 1056)  azithromycin (ZITHROMAX) 500 mg in sodium chloride  0.9 % 250 mL IVPB (0 mg Intravenous Stopped 05/25/24 1110)  lactated ringers bolus 1,000 mL (0 mLs Intravenous Stopped 05/25/24 1110)  iohexol (OMNIPAQUE) 350 MG/ML injection 75 mL (75 mLs Intravenous Contrast Given 05/25/24 1143)   Review of Systems  Constitutional:  Positive for chills and malaise/fatigue.  Neurological:  Positive for loss of consciousness.   Past Medical History:  Diagnosis Date   Abnormal EKG    Brain cyst    Chest pain    Depression    Hyperlipidemia    Insomnia    Past Surgical History:  Procedure Laterality Date   BRAIN SURGERY     removed cyst   CHOLECYSTECTOMY     COLONOSCOPY  reports that she has been smoking cigarettes. She started smoking about 56 years ago. She has a 63 pack-year smoking history. She has never used smokeless tobacco. She reports that she does not drink alcohol and does not use drugs. Allergies  Allergen Reactions   Suvorexant Other (See Comments)    Cause night terrors   Family History  Problem Relation Age of Onset   Cancer Mother        LIVER   Gout Father    Other Father        PNUEMONIA   Hypertension Father    CAD Father    Heart failure Father     Diabetes Father    Obesity Sister    Depression Sister    Other Sister        BACK PROBLEMS   Cancer - Prostate Brother    Diabetes Brother    Cancer - Other Brother        SKIN   Breast cancer Maternal Aunt    Colon cancer Neg Hx    Esophageal cancer Neg Hx    Rectal cancer Neg Hx    Stomach cancer Neg Hx    Colon polyps Neg Hx    Cancer - Colon Neg Hx    Prior to Admission medications   Medication Sig Start Date End Date Taking? Authorizing Provider  gabapentin (NEURONTIN) 300 MG capsule SMARTSIG:1 Capsule(s) By Mouth Every Evening PRN Patient not taking: Reported on 11/08/2023 11/04/21   [provider]  Lactobacillus Rhamnosus, GG, (CULTURELLE PO) Take by mouth daily.    [provider]  meloxicam  (MOBIC ) 15 MG tablet Take 1 tablet (15 mg total) by mouth daily. Patient not taking: Reported on 12/06/2023 06/15/22   Persons, Ronal Dragon, [name]  Multiple Vitamins-Minerals (MULTIVITAMIN ADULT PO) Take by mouth daily.    [provider]  simvastatin (ZOCOR) 20 MG tablet SMARTSIG:1 Tablet(s) By Mouth Every Evening 12/05/21   [provider]  venlafaxine XR (EFFEXOR-XR) 150 MG 24 hr capsule Take 150 mg by mouth daily. 12/26/21   [provider]                                                                                 Vitals:   05/25/24 1100 05/25/24 1230 05/25/24 1234 05/25/24 1235  BP: 100/64 (!) 92/59 99/60   Pulse: 88 85 85   Resp: 17 17 16    Temp:    99.4 F (37.4 C)  TempSrc:    Oral  SpO2: 92% 97% 95%   Weight:      Height:       Physical Exam Vitals and nursing note reviewed.  Constitutional:      General: She is not in acute distress.    Appearance: Normal appearance. She is not ill-appearing.  HENT:     Head: Normocephalic and atraumatic.     Right Ear: Hearing and external ear normal.     Left Ear: Hearing and external ear normal.     Nose: No nasal deformity.     Mouth/Throat:     Lips: Pink.     Tongue: Tongue does  not deviate from midline.   Eyes:  General: Lids are normal.     Extraocular Movements: Extraocular movements intact.     Pupils: Pupils are equal, round, and reactive to light.    Cardiovascular:     Rate and Rhythm: Normal rate and regular rhythm.     Heart sounds: Normal heart sounds.  Pulmonary:     Effort: Pulmonary effort is normal.     Breath sounds: Normal breath sounds.  Abdominal:     General: Bowel sounds are normal. There is no distension.     Palpations: Abdomen is soft. There is no mass.     Tenderness: There is no abdominal tenderness.   Musculoskeletal:     Right lower leg: No edema.     Left lower leg: No edema.   Skin:    General: Skin is warm.   Neurological:     General: No focal deficit present.     Mental Status: She is alert and oriented to person, place, and time.     Cranial Nerves: Cranial nerves 2-12 are intact.   Psychiatric:        Speech: Speech normal.     Labs on Admission: I have personally reviewed following labs and imaging studies CBC: Recent Labs  Lab 05/25/24 0859 05/25/24 1109  WBC 18.2*  --   NEUTROABS 15.8*  --   HGB 12.4 11.9*  HCT 36.1 35.0*  MCV 91.2  --   PLT 203  --    Basic Metabolic Panel: Recent Labs  Lab 05/25/24 0859 05/25/24 1107 05/25/24 1109  NA 134*  --  135  K 3.6  --  3.5  CL 99  --  97*  CO2 26  --   --   GLUCOSE 132*  --  125*  BUN 9  --  9  CREATININE 0.78  --  0.60  CALCIUM 8.4*  --   --   MG  --  1.5*  --    GFR: Estimated Creatinine Clearance: 59.7 mL/min (by C-G formula based on SCr of 0.6 mg/dL). Liver Function Tests: Recent Labs  Lab 05/25/24 0859  AST 76*  ALT 51*  ALKPHOS 83  BILITOT 1.5*  PROT 5.8*  ALBUMIN 3.1*   No results for input(s): LIPASE, AMYLASE in the last 168 hours. No results for input(s): AMMONIA in the last 168 hours. Coagulation Profile: Recent Labs  Lab 05/25/24 0859  INR 1.1   Cardiac Enzymes: No results for input(s): CKTOTAL, CKMB,  CKMBINDEX, TROPONINI in the last 168 hours. BNP (last 3 results) No results for input(s): PROBNP in the last 8760 hours. HbA1C: No results for input(s): HGBA1C in the last 72 hours. CBG: No results for input(s): GLUCAP in the last 168 hours. Lipid Profile: No results for input(s): CHOL, HDL, LDLCALC, TRIG, CHOLHDL, LDLDIRECT in the last 72 hours. Thyroid Function Tests: No results for input(s): TSH, T4TOTAL, FREET4, T3FREE, THYROIDAB in the last 72 hours. Anemia Panel: No results for input(s): VITAMINB12, FOLATE, FERRITIN, TIBC, IRON, RETICCTPCT in the last 72 hours. Urine analysis:    Component Value Date/Time   COLORURINE YELLOW 05/25/2024 0832   APPEARANCEUR CLEAR 05/25/2024 0832   LABSPEC 1.005 05/25/2024 0832   PHURINE 6.0 05/25/2024 0832   GLUCOSEU NEGATIVE 05/25/2024 0832   HGBUR NEGATIVE 05/25/2024 0832   BILIRUBINUR NEGATIVE 05/25/2024 0832   KETONESUR NEGATIVE 05/25/2024 0832   PROTEINUR NEGATIVE 05/25/2024 0832   NITRITE NEGATIVE 05/25/2024 0832   LEUKOCYTESUR NEGATIVE 05/25/2024 0832   Radiological Exams on Admission: CT Angio Chest PE W  and/or Wo Contrast Result Date: 05/25/2024 CLINICAL DATA:  Elevated D-dimer level, cough and chills. EXAM: CT ANGIOGRAPHY CHEST WITH CONTRAST TECHNIQUE: Multidetector CT imaging of the chest was performed using the standard protocol during bolus administration of intravenous contrast. Multiplanar CT image reconstructions and MIPs were obtained to evaluate the vascular anatomy. RADIATION DOSE REDUCTION: This exam was performed according to the departmental dose-optimization program which includes automated exposure control, adjustment of the mA and/or kV according to patient size and/or use of iterative reconstruction technique. CONTRAST:  75mL OMNIPAQUE IOHEXOL 350 MG/ML SOLN COMPARISON:  02/17/2024 FINDINGS: Cardiovascular: No filling defect is identified in the pulmonary arterial tree to suggest  pulmonary embolus. Atheromatous vascular calcification of the thoracic aorta. Mediastinum/Nodes: Upper normal sized left hilar, AP window, and infrahilar lymph nodes are likely reactive. Lungs/Pleura: Biapical pleuroparenchymal scarring with right greater than left nodularity. Stable 6 by 4 mm right lower lobe nodule on image 74 series 6. New nodularity along the right hemidiaphragm with mildly irregular margins, measuring 8 by 8 by 7 mm (volume = 200 mm^3) on image 102 series 6. Dependent atelectasis in both lower lobes. New consolidation in the lingula as shown on image 76 series 6, suspicious for pneumonia. Upper Abdomen: Unremarkable Musculoskeletal: Dextroconvex thoracic scoliosis. Review of the MIP images confirms the above findings. IMPRESSION: 1. No filling defect is identified in the pulmonary arterial tree to suggest pulmonary embolus. 2. New consolidation in the lingula, suspicious for pneumonia. 3. New nodularity along the right hemidiaphragm with mildly irregular margins, measuring 8 by 8 by 7 mm (volume = 200 mm^ 3). This is likely inflammatory given that it was not present 3 months ago. Surveillance is considered optional given the high likelihood of inflammatory origin. 4. Upper normal sized left hilar, AP window, and left infrahilar lymph nodes are likely reactive. 5. Dextroconvex thoracic scoliosis. 6.  Aortic Atherosclerosis (ICD10-I70.0). Electronically Signed   By: Ryan Salvage M.D.   On: 05/25/2024 12:22   CT Cervical Spine Wo Contrast Result Date: 05/25/2024 CLINICAL DATA:  Neck trauma (Age >= 65y) EXAM: CT CERVICAL SPINE WITHOUT CONTRAST TECHNIQUE: Multidetector CT imaging of the cervical spine was performed without intravenous contrast. Multiplanar CT image reconstructions were also generated. RADIATION DOSE REDUCTION: This exam was performed according to the departmental dose-optimization program which includes automated exposure control, adjustment of the mA and/or kV according to  patient size and/or use of iterative reconstruction technique. COMPARISON:  None Available. FINDINGS: Alignment: Mild straightening of the normal cervical lordosis. Skull base and vertebrae: Hypertrophic changes involving the atlantoaxial joint, resulting in mild central spinal canal stenosis at the craniocervical junction. Mild-to-moderate diffuse degenerative disc disease and facet arthrosis throughout the cervical spine. No osseous lesions are present. No evidence of fracture. Soft tissues and spinal canal: No paraspinous hematoma or soft tissue injury evident. Disc levels: Chronic degenerative disc disease with mild disc space narrowing at C4-5, C5-6 and C6-7. There is mild central spinal canal stenosis at these level. Mild diffuse bilateral facet arthrosis. Upper chest: Patchy, streaky biapical pleuroparenchymal opacities, likely chronic. Other: None. IMPRESSION: 1. Mild-to-moderate cervical spondylosis without evidence of acute traumatic injury. 2. Patchy biapical pleuroparenchymal fibrotic changes. Electronically Signed   By: Evalene Coho M.D.   On: 05/25/2024 09:18   DG Chest Port 1 View Result Date: 05/25/2024 CLINICAL DATA:  cough, chills EXAM: PORTABLE CHEST 1 VIEW COMPARISON:  CT the chest dated February 17, 2024. FINDINGS: The heart is normal in size. The pulmonary vasculature is unremarkable. There is mild patchy  opacification/consolidation within the left mid lung zone, which has developed in the interim. There is no effusion or pneumothorax present. There is mild dextroscoliosis of the thoracic spine. IMPRESSION: 1. Patchy opacification/consolidation of the left mid lung zone, compatible with pneumonia. Follow up radiographs of the chest are recommended to ensure resolution. Electronically Signed   By: Evalene Coho M.D.   On: 05/25/2024 09:14   CT Head Wo Contrast Result Date: 05/25/2024 CLINICAL DATA:  Head trauma, minor (Age >= 65y) EXAM: CT HEAD WITHOUT CONTRAST TECHNIQUE: Contiguous  axial images were obtained from the base of the skull through the vertex without intravenous contrast. RADIATION DOSE REDUCTION: This exam was performed according to the departmental dose-optimization program which includes automated exposure control, adjustment of the mA and/or kV according to patient size and/or use of iterative reconstruction technique. COMPARISON:  Report of an MRI of the head dated Apr 06, 2001. The actual study is not currently available for review. FINDINGS: Brain: There are encephalomalacia changes within the right frontal lobe with mild ex vacuo dilatation of the anterior horn of the right lateral ventricle. There is no evidence of hemorrhage, mass or hydrocephalus. There is no evidence of acute intracranial injury. Vascular: Negative. Skull: Status post right frontal craniotomy. Intact and unremarkable otherwise. Sinuses/Orbits: Negative. Other: None. IMPRESSION: 1. No evidence of acute traumatic injury. 2. 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 Electronically Signed   By: Evalene Coho M.D.   On: 05/25/2024 09:12   Data Reviewed: Relevant notes from primary care and specialist visits, past discharge summaries as available in EHR, including Care Everywhere . Prior diagnostic testing as pertinent to current admission diagnoses, Updated medications and problem lists for reconciliation .ED course, including vitals, labs, imaging, treatment and response to treatment,Triage notes, nursing and pharmacy notes and ED provider's notes.Notable results as noted in HPI.Discussed case with EDMD/ ED APP/ or Specialty MD on call and as needed.  Assessment & Plan  >> Syncope/collapse: 2/2 to hypovolemia and shock from sepsis.  Pt has responded well to IVF bolus and will cont to receive IVF.  Fall precautions. Pt is nonfocal and head ct is negative.   >>CAP: Cont with rocephin and azithromycin.    >>H/O atypical chest pain: IV PPI pt has h/o mobic  use.      >>Tobacco abuse: Pt briefly counseled on tobacco having negative impact on her health.  Nicotine patch.     DVT prophylaxis:  Heparin.  Consults:  None.  Advance Care Planning:    Code Status: Not on file   Family Communication:  None.  Disposition Plan:  Home.  Severity of Illness: The appropriate patient status for this patient is INPATIENT. Inpatient status is judged to be reasonable and necessary in order to provide the required intensity of service to ensure the patient's safety. The patient's presenting symptoms, physical exam findings, and initial radiographic and laboratory data in the context of their chronic comorbidities is felt to place them at high risk for further clinical deterioration. Furthermore, it is not anticipated that the patient will be medically stable for discharge from the hospital within 2 midnights of admission.   * I certify that at the point of admission it is my clinical judgment that the patient will require inpatient hospital care spanning beyond 2 midnights from the point of admission due to high intensity of service, high risk for further deterioration and high frequency of surveillance required.*  Unresulted Labs (From admission, onward)     Start  Ordered   05/26/24 0500  Occult blood card to lab, stool  Daily,   R      05/25/24 1256   05/26/24 0500  Protime-INR  Tomorrow morning,   R        05/25/24 1322   05/26/24 0500  Comprehensive metabolic panel  Tomorrow morning,   R        05/25/24 1322   05/26/24 0500  CBC  Tomorrow morning,   R        05/25/24 1322   05/25/24 1319  HIV Antibody (routine testing w rflx)  (HIV Antibody (Routine testing w reflex) panel)  Once,   R        05/25/24 1322   05/25/24 0832  Blood Culture (routine x 2)  (Undifferentiated presentation (screening labs and basic nursing orders))  BLOOD CULTURE X 2,   STAT      05/25/24 9167            Meds ordered this encounter  Medications   cefTRIAXone  (ROCEPHIN) 2 g in sodium chloride  0.9 % 100 mL IVPB    Antibiotic Indication::   CAP   azithromycin (ZITHROMAX) 500 mg in sodium chloride  0.9 % 250 mL IVPB    Antibiotic Indication::   CAP   lactated ringers bolus 1,000 mL   iohexol (OMNIPAQUE) 350 MG/ML injection 75 mL   pantoprazole (PROTONIX) injection 40 mg   lactated ringers infusion   sodium chloride  flush (NS) 0.9 % injection 3 mL   heparin injection 5,000 Units   cefTRIAXone (ROCEPHIN) 2 g in sodium chloride  0.9 % 100 mL IVPB    Antibiotic Indication::   CAP   azithromycin (ZITHROMAX) 500 mg in sodium chloride  0.9 % 250 mL IVPB    Antibiotic Indication::   CAP     Orders Placed This Encounter  Procedures   Blood Culture (routine x 2)   Resp panel by RT-PCR (RSV, Flu A&B, Covid) Anterior Nasal Swab   DG Chest Port 1 View   CT Head Wo Contrast   CT Cervical Spine Wo Contrast   CT Angio Chest PE W and/or Wo Contrast   Comprehensive metabolic panel   CBC with Differential   Protime-INR   D-dimer, quantitative   Urinalysis, w/ Reflex to Culture (Infection Suspected) -Urine, Clean Catch   Magnesium   Occult blood card to lab, stool   HIV Antibody (routine testing w rflx)   Protime-INR   Comprehensive metabolic panel   CBC   Diet regular Room service appropriate? Yes; Fluid consistency: Thin   Document height and weight   Assess and Document Glasgow Coma Scale   Document vital signs within 1-hour of fluid bolus completion.  Notify provider of abnormal vital signs despite fluid resuscitation.   Refer to Sidebar Report: Sepsis Bundle ED/IP   Notify provider for difficulties obtaining IV access   Initiate Carrier Fluid Protocol   DO NOT delay antibiotics if unable to obtain blood culture.   Swallow screen   Strict intake and output   Cardiac Monitoring Continuous x 24 hours Indications for use: Other; other indications for use: Sepsis   Maintain IV access   Vital signs   Notify physician (specify)   Mobility Protocol:  No Restrictions RN to initiate protocols based on patient's level of care   Refer to Sidebar Report Refer to ICU, Med-Surg, Progressive, and Step-Down Mobility Protocol Sidebars   If lactate (lactic acid) >2, verify repeat lactic acid order has been placed to be drawn  Document vital signs within 1-hour of fluid bolus completion and notify provider of bolus completion   Vital signs   Vital signs   Initiate Adult Central Line Maintenance and Catheter Protocol for patients with central line (CVC, PICC, Port, Hemodialysis, Trialysis)   Apply Sepsis Care Plan   Refer to Sidebar Report: Sepsis Bundle ED/IP   Assess and Document Glasgow Coma Scale   If patient diabetic or glucose greater than 140 notify physician for Sliding Scale Insulin Orders   RN may order General Admission PRN Orders utilizing General Admission PRN medications (through manage orders) for the following patient needs: allergy symptoms (Claritin), cold sores (Carmex), cough (Robitussin DM), eye irritation (Liquifilm Tears), hemorrhoids (Tucks), indigestion (Maalox), minor skin irritation (Hydrocortisone Cream), muscle pain Lucienne Gay), nose irritation (saline nasal spray) and sore throat (Chloraseptic spray).   Full code   Code Sepsis activation.  This occurs automatically when order is signed and prioritizes pharmacy, lab, and radiology services for STAT collections and interventions.  If [facility] downtime, call [facility] [phone]) to activate Code Sepsis.   monitoring by pharmacy   Consult to hospitalist   Pulse oximetry check with vital signs   Pulse oximetry (single)   I-Stat Lactic Acid, ED   I-stat chem 8, ed   I-Stat CG4 Lactic Acid   ED EKG   EKG 12-Lead   Insert peripheral IV X 1   Insert 2nd peripheral IV if not already present.   Admit to Inpatient (patient's expected length of stay will be greater than 2 midnights or inpatient only procedure)   Fall precautions   Aspiration precautions    Author: Mario LULLA Blanch,  MD 12 pm- 8 pm. [facility]. 05/25/2024 1:22 PM Please note for any communication after hours contact [facility] Assigned provider on call on Amion.

## 2024-05-25 NOTE — ED Notes (Signed)
 Transported for imagining.

## 2024-05-25 NOTE — ED Notes (Signed)
 Moved patient and called ccmd

## 2024-05-25 NOTE — Sepsis Progress Note (Signed)
 Elink monitoring for the code sepsis protocol.

## 2024-05-25 NOTE — ED Triage Notes (Signed)
 Pt coming in from home . Family found her on the floor. Pt is oriented but slow to respond. Pt was given 1000 of LR threw a 20 in the left hand. Pt reports chills x 3 days. Pt placed in c coller by ems due to neck pain. Last known normal 8pm. Daughter heard her fall . Pt vomited x 1 after fall  On scene  BP Lying 90/50 BP Sitting 80/40   Cbg 122  Gcs 14 Pressure 103/71 Spo2 95%  ra  Hr 90

## 2024-05-25 NOTE — ED Provider Notes (Signed)
 [place] EMERGENCY DEPARTMENT AT [facility] Provider Note   CSN: [id number] Arrival date & time: 05/25/24  9177     Patient presents with: Loss of Consciousness   [name] is a 68 y.o. female.   HPI 68 year old female presents for evaluation of syncope.  History is by EMS as well as the patient.  Patient was last seen normal around 8 PM last night.  The daughter heard the patient fall in the kitchen this morning and found her unconscious for a couple minutes.  It appears the patient syncopized.  EMS first noticed some soft pressures of 90/50 that went down to 80/40 with sitting and they gave her 1 L of fluid.  Patient is not on any blood thinners.  Patient did seem to hit her head and was complaining of neck pain so was put in a c-collar.  She vomited once with EMS.  The patient tells me that she has had some chills and cough for couple days.  She has had some chest pain since last night that has continued today.  She denies feeling any preceding symptoms prior to the passing out but remembers being in the kitchen on her feet but had not just stood up.  Prior to Admission medications   Medication Sig Start Date End Date Taking? Authorizing Provider  ibuprofen (ADVIL) 200 MG tablet Take 200-800 mg by mouth every 6 (six) hours as needed for moderate pain (pain score 4-6).   Yes [provider]  Lactobacillus Rhamnosus, GG, (CULTURELLE PO) Take 1 Dose by mouth daily.   Yes [provider]  Multiple Vitamins-Minerals (MULTIVITAMIN ADULT PO) Take 1 tablet by mouth daily.   Yes [provider]  simvastatin (ZOCOR) 20 MG tablet Take 20 mg by mouth daily at 6 PM. 12/05/21  Yes [provider]  venlafaxine XR (EFFEXOR-XR) 150 MG 24 hr capsule Take 150 mg by mouth daily. 12/26/21  Yes [provider]    Allergies: Suvorexant    Review of Systems  Constitutional:  Positive for chills. Negative for fever.  Respiratory:  Positive for  cough. Negative for shortness of breath.   Cardiovascular:  Positive for chest pain.  Gastrointestinal:  Negative for abdominal pain.  Musculoskeletal:  Negative for neck pain.  Neurological:  Positive for syncope. Negative for dizziness and light-headedness.    Updated Vital Signs BP 113/64   Pulse 81   Temp 99.4 F (37.4 C) (Oral)   Resp 19   Ht 5' 5 (1.651 m)   Wt 59 kg   SpO2 98%   BMI 21.63 kg/m   Physical Exam Vitals and nursing note reviewed.  Constitutional:      Appearance: She is well-developed.     Interventions: Cervical collar in place.  HENT:     Head: Normocephalic.     Nose:     Comments: Small abrasion over nose  Eyes:     Extraocular Movements: Extraocular movements intact.    Cardiovascular:     Rate and Rhythm: Regular rhythm. Tachycardia present.     Heart sounds: Normal heart sounds.     Comments: HR low 100s Pulmonary:     Effort: Pulmonary effort is normal.     Breath sounds: Normal breath sounds.  Abdominal:     Palpations: Abdomen is soft.     Tenderness: There is no abdominal tenderness.   Musculoskeletal:     Cervical back: No spinous process tenderness or muscular tenderness.   Skin:  General: Skin is warm and dry.   Neurological:     Mental Status: She is alert.     Comments: Patient is slow to respond but able to answer orientation questions (though said July instead of June). No facial droop or slurred speech. 5/5 strength in all 4 extremities.    (all labs ordered are listed, but only abnormal results are displayed) Labs Reviewed  COMPREHENSIVE METABOLIC PANEL WITH GFR - Abnormal; Notable for the following components:      Result Value   Sodium 134 (*)    Glucose, Bld 132 (*)    Calcium 8.4 (*)    Total Protein 5.8 (*)    Albumin 3.1 (*)    AST 76 (*)    ALT 51 (*)    Total Bilirubin 1.5 (*)    All other components within normal limits  CBC WITH DIFFERENTIAL/PLATELET - Abnormal; Notable for the following  components:   WBC 18.2 (*)    Neutro Abs 15.8 (*)    Monocytes Absolute 1.4 (*)    Abs Immature Granulocytes 0.17 (*)    All other components within normal limits  D-DIMER, QUANTITATIVE - Abnormal; Notable for the following components:   D-Dimer, Quant 0.77 (*)    All other components within normal limits  MAGNESIUM - Abnormal; Notable for the following components:   Magnesium 1.5 (*)    All other components within normal limits  I-STAT CHEM 8, ED - Abnormal; Notable for the following components:   Chloride 97 (*)    Glucose, Bld 125 (*)    Calcium, Ion 1.05 (*)    Hemoglobin 11.9 (*)    HCT 35.0 (*)    All other components within normal limits  RESP PANEL BY RT-PCR (RSV, FLU A&B, COVID)  RVPGX2  CULTURE, BLOOD (ROUTINE X 2)  CULTURE, BLOOD (ROUTINE X 2)  PROTIME-INR  URINALYSIS, W/ REFLEX TO CULTURE (INFECTION SUSPECTED)  HIV ANTIBODY (ROUTINE TESTING W REFLEX)  I-STAT CG4 LACTIC ACID, ED  I-STAT CG4 LACTIC ACID, ED  I-STAT CG4 LACTIC ACID, ED  TROPONIN I (HIGH SENSITIVITY)  TROPONIN I (HIGH SENSITIVITY)    EKG: EKG Interpretation Date/Time:  Thursday May 25 2024 09:01:14 EDT Ventricular Rate:  90 PR Interval:  151 QRS Duration:  90 QT Interval:  320 QTC Calculation: 392 R Axis:   76  Text Interpretation: Sinus rhythm Atrial premature complex Low voltage, precordial leads nonspecific ST/T changes Confirmed by Freddi Hamilton [id number]) on 05/25/2024 11:02:22 AM  Radiology: CT Angio Chest PE W and/or Wo Contrast Result Date: 05/25/2024 CLINICAL DATA:  Elevated D-dimer level, cough and chills. EXAM: CT ANGIOGRAPHY CHEST WITH CONTRAST TECHNIQUE: Multidetector CT imaging of the chest was performed using the standard protocol during bolus administration of intravenous contrast. Multiplanar CT image reconstructions and MIPs were obtained to evaluate the vascular anatomy. RADIATION DOSE REDUCTION: This exam was performed according to the departmental dose-optimization program  which includes automated exposure control, adjustment of the mA and/or kV according to patient size and/or use of iterative reconstruction technique. CONTRAST:  75mL OMNIPAQUE IOHEXOL 350 MG/ML SOLN COMPARISON:  02/17/2024 FINDINGS: Cardiovascular: No filling defect is identified in the pulmonary arterial tree to suggest pulmonary embolus. Atheromatous vascular calcification of the thoracic aorta. Mediastinum/Nodes: Upper normal sized left hilar, AP window, and infrahilar lymph nodes are likely reactive. Lungs/Pleura: Biapical pleuroparenchymal scarring with right greater than left nodularity. Stable 6 by 4 mm right lower lobe nodule on image 74 series 6. New nodularity along the right hemidiaphragm with  mildly irregular margins, measuring 8 by 8 by 7 mm (volume = 200 mm^3) on image 102 series 6. Dependent atelectasis in both lower lobes. New consolidation in the lingula as shown on image 76 series 6, suspicious for pneumonia. Upper Abdomen: Unremarkable Musculoskeletal: Dextroconvex thoracic scoliosis. Review of the MIP images confirms the above findings. IMPRESSION: 1. No filling defect is identified in the pulmonary arterial tree to suggest pulmonary embolus. 2. New consolidation in the lingula, suspicious for pneumonia. 3. New nodularity along the right hemidiaphragm with mildly irregular margins, measuring 8 by 8 by 7 mm (volume = 200 mm^ 3). This is likely inflammatory given that it was not present 3 months ago. Surveillance is considered optional given the high likelihood of inflammatory origin. 4. Upper normal sized left hilar, AP window, and left infrahilar lymph nodes are likely reactive. 5. Dextroconvex thoracic scoliosis. 6.  Aortic Atherosclerosis (ICD10-I70.0). Electronically Signed   By: Ryan Salvage M.D.   On: 05/25/2024 12:22   CT Cervical Spine Wo Contrast Result Date: 05/25/2024 CLINICAL DATA:  Neck trauma (Age >= 65y) EXAM: CT CERVICAL SPINE WITHOUT CONTRAST TECHNIQUE: Multidetector CT  imaging of the cervical spine was performed without intravenous contrast. Multiplanar CT image reconstructions were also generated. RADIATION DOSE REDUCTION: This exam was performed according to the departmental dose-optimization program which includes automated exposure control, adjustment of the mA and/or kV according to patient size and/or use of iterative reconstruction technique. COMPARISON:  None Available. FINDINGS: Alignment: Mild straightening of the normal cervical lordosis. Skull base and vertebrae: Hypertrophic changes involving the atlantoaxial joint, resulting in mild central spinal canal stenosis at the craniocervical junction. Mild-to-moderate diffuse degenerative disc disease and facet arthrosis throughout the cervical spine. No osseous lesions are present. No evidence of fracture. Soft tissues and spinal canal: No paraspinous hematoma or soft tissue injury evident. Disc levels: Chronic degenerative disc disease with mild disc space narrowing at C4-5, C5-6 and C6-7. There is mild central spinal canal stenosis at these level. Mild diffuse bilateral facet arthrosis. Upper chest: Patchy, streaky biapical pleuroparenchymal opacities, likely chronic. Other: None. IMPRESSION: 1. Mild-to-moderate cervical spondylosis without evidence of acute traumatic injury. 2. Patchy biapical pleuroparenchymal fibrotic changes. Electronically Signed   By: Evalene Coho M.D.   On: 05/25/2024 09:18   DG Chest Port 1 View Result Date: 05/25/2024 CLINICAL DATA:  cough, chills EXAM: PORTABLE CHEST 1 VIEW COMPARISON:  CT the chest dated February 17, 2024. FINDINGS: The heart is normal in size. The pulmonary vasculature is unremarkable. There is mild patchy opacification/consolidation within the left mid lung zone, which has developed in the interim. There is no effusion or pneumothorax present. There is mild dextroscoliosis of the thoracic spine. IMPRESSION: 1. Patchy opacification/consolidation of the left mid lung zone,  compatible with pneumonia. Follow up radiographs of the chest are recommended to ensure resolution. Electronically Signed   By: Evalene Coho M.D.   On: 05/25/2024 09:14   CT Head Wo Contrast Result Date: 05/25/2024 CLINICAL DATA:  Head trauma, minor (Age >= 65y) EXAM: CT HEAD WITHOUT CONTRAST TECHNIQUE: Contiguous axial images were obtained from the base of the skull through the vertex without intravenous contrast. RADIATION DOSE REDUCTION: This exam was performed according to the departmental dose-optimization program which includes automated exposure control, adjustment of the mA and/or kV according to patient size and/or use of iterative reconstruction technique. COMPARISON:  Report of an MRI of the head dated Apr 06, 2001. The actual study is not currently available for review. FINDINGS: Brain: There are  encephalomalacia changes within the right frontal lobe with mild ex vacuo dilatation of the anterior horn of the right lateral ventricle. There is no evidence of hemorrhage, mass or hydrocephalus. There is no evidence of acute intracranial injury. Vascular: Negative. Skull: Status post right frontal craniotomy. Intact and unremarkable otherwise. Sinuses/Orbits: Negative. Other: None. IMPRESSION: 1. No evidence of acute traumatic injury. 2. 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 Electronically Signed   By: Evalene Coho M.D.   On: 05/25/2024 09:12     Procedures   Medications Ordered in the ED  pantoprazole (PROTONIX) injection 40 mg (40 mg Intravenous Given 05/25/24 1334)  lactated ringers infusion (75 mL/hr Intravenous New Bag/Given 05/25/24 1335)  sodium chloride  flush (NS) 0.9 % injection 3 mL (3 mLs Intravenous Not Given 05/25/24 1406)  heparin injection 5,000 Units (has no administration in time range)  cefTRIAXone (ROCEPHIN) 2 g in sodium chloride  0.9 % 100 mL IVPB (has no administration in time range)  azithromycin (ZITHROMAX) 500 mg in sodium chloride  0.9 %  250 mL IVPB (has no administration in time range)  cefTRIAXone (ROCEPHIN) 2 g in sodium chloride  0.9 % 100 mL IVPB (0 g Intravenous Stopped 05/25/24 1056)  azithromycin (ZITHROMAX) 500 mg in sodium chloride  0.9 % 250 mL IVPB (0 mg Intravenous Stopped 05/25/24 1110)  lactated ringers bolus 1,000 mL (0 mLs Intravenous Stopped 05/25/24 1110)  iohexol (OMNIPAQUE) 350 MG/ML injection 75 mL (75 mLs Intravenous Contrast Given 05/25/24 1143)                                    Medical Decision Making Amount and/or Complexity of Data Reviewed Independent Historian: EMS External Data Reviewed: notes. Labs: ordered.    Details: Leukocytosis Radiology: ordered and independent interpretation performed.    Details: Pneumonia. No PE ECG/medicine tests: ordered and independent interpretation performed.    Details: Nonspecific changes  Risk Prescription drug management. Decision regarding hospitalization.   Patient presents with syncope at home.  She is found to have pneumonia with a significant leukocytosis.  She was hypotensive with EMS though no longer here after liter of fluid.  Lactate is okay.  Overall workup does not show any obvious MI or PE.  She was given some fluids and started on Rocephin and azithromycin.  With her syncope I think she will need observation.  Discussed with Dr. Tobie.     Final diagnoses:  Community acquired pneumonia of left lung, unspecified part of lung  Syncope and collapse    ED Discharge Orders     None          Freddi Hamilton, MD 05/25/24 1455

## 2024-05-26 DIAGNOSIS — R55 Syncope and collapse: Secondary | ICD-10-CM | POA: Diagnosis not present

## 2024-05-26 DIAGNOSIS — J189 Pneumonia, unspecified organism: Secondary | ICD-10-CM

## 2024-05-26 LAB — COMPREHENSIVE METABOLIC PANEL WITH GFR
ALT: 42 U/L (ref 0–44)
AST: 42 U/L — ABNORMAL HIGH (ref 15–41)
Albumin: 2.5 g/dL — ABNORMAL LOW (ref 3.5–5.0)
Alkaline Phosphatase: 66 U/L (ref 38–126)
Anion gap: 11 (ref 5–15)
BUN: 9 mg/dL (ref 8–23)
CO2: 23 mmol/L (ref 22–32)
Calcium: 8.4 mg/dL — ABNORMAL LOW (ref 8.9–10.3)
Chloride: 103 mmol/L (ref 98–111)
Creatinine, Ser: 0.59 mg/dL (ref 0.44–1.00)
GFR, Estimated: >60 mL/min (ref >60)
Glucose, Bld: 104 mg/dL — ABNORMAL HIGH (ref 70–99)
Potassium: 3.4 mmol/L — ABNORMAL LOW (ref 3.5–5.1)
Sodium: 137 mmol/L (ref 135–145)
Total Bilirubin: 0.7 mg/dL (ref 0.0–1.2)
Total Protein: 4.9 g/dL — ABNORMAL LOW (ref 6.5–8.1)

## 2024-05-26 LAB — CBC
HCT: 34.1 % — ABNORMAL LOW (ref 36.0–46.0)
Hemoglobin: 11.4 g/dL — ABNORMAL LOW (ref 12.0–15.0)
MCH: 31.1 pg (ref 26.0–34.0)
MCHC: 33.4 g/dL (ref 30.0–36.0)
MCV: 92.9 fL (ref 80.0–100.0)
Platelets: 179 10*3/uL (ref 150–400)
RBC: 3.67 MIL/uL — ABNORMAL LOW (ref 3.87–5.11)
RDW: 11.8 % (ref 11.5–15.5)
WBC: 14 10*3/uL — ABNORMAL HIGH (ref 4.0–10.5)
nRBC: 0 % (ref 0.0–0.2)

## 2024-05-26 LAB — PROTIME-INR
INR: 1.2 (ref 0.8–1.2)
Prothrombin Time: 15.4 s — ABNORMAL HIGH (ref 11.4–15.2)

## 2024-05-26 MED ORDER — GUAIFENESIN ER 600 MG PO TB12
1200.0000 mg | ORAL_TABLET | Freq: Two times a day (BID) | ORAL | Status: DC
  Administered 2024-05-26: 1200 mg via ORAL
  Filled 2024-05-26: qty 2

## 2024-05-26 MED ORDER — LEVALBUTEROL HCL 0.63 MG/3ML IN NEBU: 0.6300 mg | INHALATION_SOLUTION | Freq: Four times a day (QID) | RESPIRATORY_TRACT | Status: DC

## 2024-05-26 MED ORDER — IPRATROPIUM BROMIDE 0.02 % IN SOLN: 0.5000 mg | Freq: Four times a day (QID) | RESPIRATORY_TRACT | Status: DC

## 2024-05-26 MED ORDER — POTASSIUM CHLORIDE CRYS ER 20 MEQ PO TBCR
40.0000 meq | EXTENDED_RELEASE_TABLET | Freq: Two times a day (BID) | ORAL | Status: DC
  Administered 2024-05-26: 40 meq via ORAL
  Filled 2024-05-26: qty 2

## 2024-05-26 NOTE — Progress Notes (Signed)
 Pt. Requesting to sign out against medical advice, pt. Made aware of potential risks of signing out AMA and pt. Verbalized understanding, MD aware  Doyal Sias

## 2024-05-26 NOTE — Discharge Summary (Signed)
 Physician Discharge Summary   Patient: [name] MRN: [id number] DOB: [birth year]  Admit date:     05/25/2024  Discharge date: 05/26/24  Discharge Physician: Alejandro Marker, DO   PCP: Patient, No Pcp Per   Recommendations at discharge:   Follow-up with PCP within 1 to 2 weeks and repeat CBC, CMP, mag, Phos within 1 weeks  Discharge Diagnoses: Principal Problem:   Syncope and collapse  Resolved Problems:   * No resolved hospital problems. [facility] Course: Patient is a 68 year old female with past medical history of tobacco abuse, emphysema, depression, hyperlipidemia, allergies, history of brain surgery for cysts and cholecystectomy who presented with syncope and collapse and loss of consciousness.  Family found her on the floor and heard a fall and she was initially oriented but very lethargic.  Blood pressure was low upon admission and she was given 1 L boluses.  She was admitted and found to have a pneumonia however subsequently refused to stay for syncope workup and treatment for pneumonia and signed out AGAINST MEDICAL ADVICE understand the risks of further worsening, decompensation and even possible death.  Assessment and Plan:  Syncope/collapse: Likely 2/2 to hypovolemia and shock from sepsis.  Pt has responded well to IVF bolus and will cont to receive IVF.  Fall precautions. Pt is nonfocal and head ct is negative.   - Was going to obtain orthostatics, PT, OT evaluation, echocardiogram however she signed out AMA prior to this   CAP: Cont with rocephin  and azithromycin  while she was hospitalized but signed out AMA. WBC went from    Atypical chest pain: IV PPI pt has h/o mobic  use.   Hypokalemia: replete w/ po Kcl  Normocytic Anemia: Hgb/Hct was 11.4/34.1. Wanted to Check Anemia Panel but she signed out AMA  Hypoalbuminemia: Patient's Albumin Level went from 3.1 -> 2.5. Signed out AMA  Consultants: None Procedures performed: As delineated as above   Disposition:  SIGNED OUT AGAINST MEDICAL ADVICE  Diet recommendation:  Cardiac diet DISCHARGE MEDICATION: Allergies as of 05/26/2024       Reactions   Suvorexant Other (See Comments)   Cause night terrors        Medication List     ASK your doctor about these medications    CULTURELLE PO Take 1 Dose by mouth daily.   ibuprofen 200 MG tablet Commonly known as: ADVIL Take 200-800 mg by mouth every 6 (six) hours as needed for moderate pain (pain score 4-6).   MULTIVITAMIN ADULT PO Take 1 tablet by mouth daily.   simvastatin 20 MG tablet Commonly known as: ZOCOR Take 20 mg by mouth daily at 6 PM.   venlafaxine XR 150 MG 24 hr capsule Commonly known as: EFFEXOR-XR Take 150 mg by mouth daily.        Discharge Exam: Filed Weights   05/25/24 0833 05/25/24 1526  Weight: 59 kg 60.8 kg   Vitals:   05/25/24 2038 05/26/24 0429  BP: (!) 102/59 114/68  Pulse: 70 82  Resp: 18 16  Temp: 98.5 F (36.9 C) 98.7 F (37.1 C)  SpO2: 96% 97%   Examination: Physical Exam:  Constitutional: Thin Caucasian female in no acute distress Respiratory: Diminished to auscultation bilaterally with some coarse breath sounds, no wheezing, rales, rhonchi or crackles. Normal respiratory effort and patient is not tachypenic. No accessory muscle use.  Cardiovascular: RRR, no murmurs / rubs / gallops. S1 and S2 auscultated. No extremity edema. Abdomen: Soft, non-tender, non-distended.Bowel sounds positive.  Neurologic: CN 2-12  grossly intact with no focal deficits. Romberg sign and cerebellar reflexes not assessed.  Psychiatric: Awake and alert and oriented x3  Condition at discharge: Guarded  The results of significant diagnostics from this hospitalization (including imaging, microbiology, ancillary and laboratory) are listed below for reference.   Imaging Studies: CT Angio Chest PE W and/or Wo Contrast Result Date: 05/25/2024 CLINICAL DATA:  Elevated D-dimer level, cough and chills. EXAM: CT  ANGIOGRAPHY CHEST WITH CONTRAST TECHNIQUE: Multidetector CT imaging of the chest was performed using the standard protocol during bolus administration of intravenous contrast. Multiplanar CT image reconstructions and MIPs were obtained to evaluate the vascular anatomy. RADIATION DOSE REDUCTION: This exam was performed according to the departmental dose-optimization program which includes automated exposure control, adjustment of the mA and/or kV according to patient size and/or use of iterative reconstruction technique. CONTRAST:  75mL OMNIPAQUE  IOHEXOL  350 MG/ML SOLN COMPARISON:  02/17/2024 FINDINGS: Cardiovascular: No filling defect is identified in the pulmonary arterial tree to suggest pulmonary embolus. Atheromatous vascular calcification of the thoracic aorta. Mediastinum/Nodes: Upper normal sized left hilar, AP window, and infrahilar lymph nodes are likely reactive. Lungs/Pleura: Biapical pleuroparenchymal scarring with right greater than left nodularity. Stable 6 by 4 mm right lower lobe nodule on image 74 series 6. New nodularity along the right hemidiaphragm with mildly irregular margins, measuring 8 by 8 by 7 mm (volume = 200 mm^3) on image 102 series 6. Dependent atelectasis in both lower lobes. New consolidation in the lingula as shown on image 76 series 6, suspicious for pneumonia. Upper Abdomen: Unremarkable Musculoskeletal: Dextroconvex thoracic scoliosis. Review of the MIP images confirms the above findings. IMPRESSION: 1. No filling defect is identified in the pulmonary arterial tree to suggest pulmonary embolus. 2. New consolidation in the lingula, suspicious for pneumonia. 3. New nodularity along the right hemidiaphragm with mildly irregular margins, measuring 8 by 8 by 7 mm (volume = 200 mm^ 3). This is likely inflammatory given that it was not present 3 months ago. Surveillance is considered optional given the high likelihood of inflammatory origin. 4. Upper normal sized left hilar, AP window,  and left infrahilar lymph nodes are likely reactive. 5. Dextroconvex thoracic scoliosis. 6.  Aortic Atherosclerosis (ICD10-I70.0). Electronically Signed   By: Ryan Salvage M.D.   On: 05/25/2024 12:22   CT Cervical Spine Wo Contrast Result Date: 05/25/2024 CLINICAL DATA:  Neck trauma (Age >= 65y) EXAM: CT CERVICAL SPINE WITHOUT CONTRAST TECHNIQUE: Multidetector CT imaging of the cervical spine was performed without intravenous contrast. Multiplanar CT image reconstructions were also generated. RADIATION DOSE REDUCTION: This exam was performed according to the departmental dose-optimization program which includes automated exposure control, adjustment of the mA and/or kV according to patient size and/or use of iterative reconstruction technique. COMPARISON:  None Available. FINDINGS: Alignment: Mild straightening of the normal cervical lordosis. Skull base and vertebrae: Hypertrophic changes involving the atlantoaxial joint, resulting in mild central spinal canal stenosis at the craniocervical junction. Mild-to-moderate diffuse degenerative disc disease and facet arthrosis throughout the cervical spine. No osseous lesions are present. No evidence of fracture. Soft tissues and spinal canal: No paraspinous hematoma or soft tissue injury evident. Disc levels: Chronic degenerative disc disease with mild disc space narrowing at C4-5, C5-6 and C6-7. There is mild central spinal canal stenosis at these level. Mild diffuse bilateral facet arthrosis. Upper chest: Patchy, streaky biapical pleuroparenchymal opacities, likely chronic. Other: None. IMPRESSION: 1. Mild-to-moderate cervical spondylosis without evidence of acute traumatic injury. 2. Patchy biapical pleuroparenchymal fibrotic changes. Electronically Signed  By: Evalene Coho M.D.   On: 05/25/2024 09:18   DG Chest Port 1 View Result Date: 05/25/2024 CLINICAL DATA:  cough, chills EXAM: PORTABLE CHEST 1 VIEW COMPARISON:  CT the chest dated February 17, 2024. FINDINGS: The heart is normal in size. The pulmonary vasculature is unremarkable. There is mild patchy opacification/consolidation within the left mid lung zone, which has developed in the interim. There is no effusion or pneumothorax present. There is mild dextroscoliosis of the thoracic spine. IMPRESSION: 1. Patchy opacification/consolidation of the left mid lung zone, compatible with pneumonia. Follow up radiographs of the chest are recommended to ensure resolution. Electronically Signed   By: Evalene Coho M.D.   On: 05/25/2024 09:14   CT Head Wo Contrast Result Date: 05/25/2024 CLINICAL DATA:  Head trauma, minor (Age >= 65y) EXAM: CT HEAD WITHOUT CONTRAST TECHNIQUE: Contiguous axial images were obtained from the base of the skull through the vertex without intravenous contrast. RADIATION DOSE REDUCTION: This exam was performed according to the departmental dose-optimization program which includes automated exposure control, adjustment of the mA and/or kV according to patient size and/or use of iterative reconstruction technique. COMPARISON:  Report of an MRI of the head dated Apr 06, 2001. The actual study is not currently available for review. FINDINGS: Brain: There are encephalomalacia changes within the right frontal lobe with mild ex vacuo dilatation of the anterior horn of the right lateral ventricle. There is no evidence of hemorrhage, mass or hydrocephalus. There is no evidence of acute intracranial injury. Vascular: Negative. Skull: Status post right frontal craniotomy. Intact and unremarkable otherwise. Sinuses/Orbits: Negative. Other: None. IMPRESSION: 1. No evidence of acute traumatic injury. 2. 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 Electronically Signed   By: Evalene Coho M.D.   On: 05/25/2024 09:12   Microbiology: Results for orders placed or performed during the hospital encounter of 05/25/24  Blood Culture (routine x 2)     Status: None (Preliminary  result)   Collection Time: 05/25/24  8:35 AM   Specimen: BLOOD  Result Value Ref Range Status   Specimen Description BLOOD RIGHT ANTECUBITAL  Final   Special Requests   Final    BOTTLES DRAWN AEROBIC AND ANAEROBIC Blood Culture adequate volume   Culture   Final    NO GROWTH < 24 HOURS Performed at [facility], [address], [place], [place] [address]    Report Status PENDING  Incomplete  Resp panel by RT-PCR (RSV, Flu A&B, Covid) Anterior Nasal Swab     Status: None   Collection Time: 05/25/24  8:39 AM   Specimen: Anterior Nasal Swab  Result Value Ref Range Status   SARS Coronavirus 2 by RT PCR NEGATIVE NEGATIVE Final   Influenza A by PCR NEGATIVE NEGATIVE Final   Influenza B by PCR NEGATIVE NEGATIVE Final    Comment: (NOTE) The Xpert Xpress SARS-CoV-2/FLU/RSV plus assay is intended as an aid in the diagnosis of influenza from Nasopharyngeal swab specimens and should not be used as a sole basis for treatment. Nasal washings and aspirates are unacceptable for Xpert Xpress SARS-CoV-2/FLU/RSV testing.  Fact Sheet for Patients: BloggerCourse.com  Fact Sheet for Healthcare Providers: SeriousBroker.it  This test is not yet approved or cleared by the [place]  FDA and has been authorized for detection and/or diagnosis of SARS-CoV-2 by FDA under an Emergency Use Authorization (EUA). This EUA will remain in effect (meaning this test can be used) for the duration of the COVID-19 declaration under Section 564(b)(1)  of the Act, 21 U.S.C. section 360bbb-3(b)(1), unless the authorization is terminated or revoked.     Resp Syncytial Virus by PCR NEGATIVE NEGATIVE Final    Comment: (NOTE) Fact Sheet for Patients: BloggerCourse.com  Fact Sheet for Healthcare Providers: SeriousBroker.it  This test is not yet approved or cleared by the [place]  FDA and has been authorized  for detection and/or diagnosis of SARS-CoV-2 by FDA under an Emergency Use Authorization (EUA). This EUA will remain in effect (meaning this test can be used) for the duration of the COVID-19 declaration under Section 564(b)(1) of the Act, 21 U.S.C. section 360bbb-3(b)(1), unless the authorization is terminated or revoked.  Performed at [facility], [address], [place], [place] [address]   Blood Culture (routine x 2)     Status: None (Preliminary result)   Collection Time: 05/25/24  9:12 AM   Specimen: BLOOD LEFT FOREARM  Result Value Ref Range Status   Specimen Description BLOOD LEFT FOREARM  Final   Special Requests   Final    BOTTLES DRAWN AEROBIC AND ANAEROBIC Blood Culture results may not be optimal due to an inadequate volume of blood received in culture bottles   Culture   Final    NO GROWTH < 24 HOURS Performed at [facility], [address], [place], [place] [address]    Report Status PENDING  Incomplete   Labs: CBC: Recent Labs  Lab 05/25/24 0859 05/25/24 1109 05/26/24 0625  WBC 18.2*  --  14.0*  NEUTROABS 15.8*  --   --   HGB 12.4 11.9* 11.4*  HCT 36.1 35.0* 34.1*  MCV 91.2  --  92.9  PLT 203  --  179   Basic Metabolic Panel: Recent Labs  Lab 05/25/24 0859 05/25/24 1107 05/25/24 1109 05/26/24 0625  NA 134*  --  135 137  K 3.6  --  3.5 3.4*  CL 99  --  97* 103  CO2 26  --   --  23  GLUCOSE 132*  --  125* 104*  BUN 9  --  9 9  CREATININE 0.78  --  0.60 0.59  CALCIUM 8.4*  --   --  8.4*  MG  --  1.5*  --   --    Liver Function Tests: Recent Labs  Lab 05/25/24 0859 05/26/24 0625  AST 76* 42*  ALT 51* 42  ALKPHOS 83 66  BILITOT 1.5* 0.7  PROT 5.8* 4.9*  ALBUMIN 3.1* 2.5*   CBG: No results for input(s): GLUCAP in the last 168 hours.  Discharge time spent: less than 30 minutes.  Signed: Alejandro Marker, DO [facility] 05/26/2024

## 2024-05-30 LAB — CULTURE, BLOOD (ROUTINE X 2)
Culture: NO GROWTH
Culture: NO GROWTH
Special Requests: ADEQUATE

## 2024-06-01 DIAGNOSIS — R55 Syncope and collapse: Secondary | ICD-10-CM | POA: Diagnosis not present

## 2024-06-01 DIAGNOSIS — J189 Pneumonia, unspecified organism: Secondary | ICD-10-CM | POA: Diagnosis not present

## 2024-06-08 DIAGNOSIS — J189 Pneumonia, unspecified organism: Secondary | ICD-10-CM | POA: Diagnosis not present

## 2024-06-15 ENCOUNTER — Other Ambulatory Visit (HOSPITAL_COMMUNITY): Payer: Self-pay | Admitting: Family Medicine

## 2024-06-15 DIAGNOSIS — R55 Syncope and collapse: Secondary | ICD-10-CM

## 2024-06-26 ENCOUNTER — Ambulatory Visit (HOSPITAL_COMMUNITY)
Admission: RE | Admit: 2024-06-26 | Discharge: 2024-06-26 | Disposition: A | Discharge status: Home / Self Care | Source: Ambulatory Visit | Attending: Cardiology | Admitting: Cardiology

## 2024-06-26 DIAGNOSIS — R55 Syncope and collapse: Secondary | ICD-10-CM | POA: Insufficient documentation

## 2024-06-26 LAB — ECHOCARDIOGRAM COMPLETE
AR max vel: 2.97 cm2
AV Peak grad: 3.9 mmHg
Ao pk vel: 0.99 m/s
Area-P 1/2: 4.41 cm2
S' Lateral: 2.4 cm

## 2024-08-07 DIAGNOSIS — Z8701 Personal history of pneumonia (recurrent): Secondary | ICD-10-CM | POA: Diagnosis not present

## 2024-08-07 DIAGNOSIS — M25561 Pain in right knee: Secondary | ICD-10-CM | POA: Diagnosis not present

## 2024-08-15 ENCOUNTER — Other Ambulatory Visit (INDEPENDENT_AMBULATORY_CARE_PROVIDER_SITE_OTHER): Payer: Self-pay

## 2024-08-15 ENCOUNTER — Ambulatory Visit: Admitting: Physician Assistant

## 2024-08-15 ENCOUNTER — Encounter: Payer: Self-pay | Admitting: Physician Assistant

## 2024-08-15 DIAGNOSIS — M25561 Pain in right knee: Secondary | ICD-10-CM

## 2024-08-15 DIAGNOSIS — G8929 Other chronic pain: Secondary | ICD-10-CM

## 2024-08-15 MED ORDER — METHYLPREDNISOLONE ACETATE 40 MG/ML IJ SUSP
40.0000 mg | INTRAMUSCULAR | Status: AC | PRN
  Administered 2024-08-15: 40 mg via INTRA_ARTICULAR

## 2024-08-15 MED ORDER — LIDOCAINE HCL 1 % IJ SOLN
4.0000 mL | INTRAMUSCULAR | Status: AC | PRN
  Administered 2024-08-15: 4 mL

## 2024-08-15 NOTE — Progress Notes (Signed)
 Office Visit Note   Patient: [name]           Date of Birth: 09-17-1956           MRN: [id number] Visit Date: 08/15/2024              Requested by: No referring provider defined for this encounter. PCP: Patient, No Pcp Per   Assessment & Plan: Visit Diagnoses:  1. Chronic pain of right knee     Plan: Patient is a 68 year old woman who comes in today with right medial knee pain.  This began when she tried jogging.  She normally enjoys walking 3 miles daily.  She was advised by her primary care provider to give this a rest.  She has and she has been icing the knee which helps a little bit but she still continues to have knee pain and would like to get back to walking.  We talked about avoiding high impact activities like jogging and she is willing to do this.  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  Discussed Voltaren gel also offered her an intra-articular knee injection which she would like to go forward with today discussed the risks and benefits from this may follow-up with me as needed  Follow-Up Instructions: Return if symptoms worsen or fail to improve.   Orders:  Orders Placed This Encounter  Procedures  . XR KNEE 3 VIEW RIGHT   No orders of the defined types were placed in this encounter.     Procedures: Large Joint Inj: R knee on 08/15/2024 3:26 PM Indications: pain and diagnostic evaluation Details: 25 G 1.5 in needle, anteromedial approach  Arthrogram: No  Medications: 40 mg methylPREDNISolone  acetate 40 MG/ML; 4 mL lidocaine  1 % Outcome: tolerated well, no immediate complications Procedure, treatment alternatives, risks and benefits explained, specific risks discussed. Consent was given by the patient.      Clinical Data: No additional findings.   Subjective: No chief complaint on file.   HPI patient is a 68 year old woman who comes in today with right knee pain.  She notices when she is jogging she has knee pain she has been  taking 1600 mg of ibuprofen a day.  She finds walking painful sometimes throbbing  Review of Systems  All other systems reviewed and are negative.    Objective: Vital Signs: There were no vitals taken for this visit.  Physical Exam Constitutional:      Appearance: Normal appearance.  Pulmonary:     Effort: Pulmonary effort is normal.     Breath sounds: Normal breath sounds.  Skin:    General: Skin is warm and dry.  Neurological:     General: No focal deficit present.     Mental Status: She is alert and oriented to person, place, and time.  Psychiatric:        Mood and Affect: Mood normal.        Behavior: Behavior normal.     Ortho Exam Right knee no effusion no erythema compartments are soft and nontender she has good varus valgus anterior stability.  Some tenderness over the medial joint line.  Good active extension and flexion.  Mild soft tissue swelling Specialty Comments:  No specialty comments available.  Imaging: XR KNEE 3 VIEW RIGHT Result Date: 08/15/2024 Radiographs of the right knee well-maintained alignment possibly some CPPD no acute osseous lesions    PMFS History: Patient Active Problem List   Diagnosis Date Noted  . Syncope and collapse  05/25/2024  . Smoker 11/08/2023  . Unilateral primary osteoarthritis, left knee 06/26/2022  . Pain in left knee 06/15/2022  . Chronic insomnia 01/01/2022  . Moderate recurrent major depression (HCC) 01/01/2022  . Pure hypercholesterolemia 01/01/2022  . Aortic atherosclerosis (HCC) 03/06/2021  . Pulmonary nodule 03/06/2021  . Chest pain of uncertain etiology 12/03/2020  . Mixed hyperlipidemia 12/03/2020  . Morbid obesity (HCC) 12/03/2020   Past Medical History:  Diagnosis Date  . Abnormal EKG   . Brain cyst   . Chest pain   . Depression   . Hyperlipidemia   . Insomnia     Family History  Problem Relation Age of Onset  . Cancer Mother        LIVER  . Gout Father   . Other Father        PNUEMONIA  .  Hypertension Father   . CAD Father   . Heart failure Father   . Diabetes Father   . Obesity Sister   . Depression Sister   . Other Sister        BACK PROBLEMS  . Cancer - Prostate Brother   . Diabetes Brother   . Cancer - Other Brother        SKIN  . Breast cancer Maternal Aunt   . Colon cancer Neg Hx   . Esophageal cancer Neg Hx   . Rectal cancer Neg Hx   . Stomach cancer Neg Hx   . Colon polyps Neg Hx   . Cancer - Colon Neg Hx     Past Surgical History:  Procedure Laterality Date  . BRAIN SURGERY     removed cyst  . CHOLECYSTECTOMY    . COLONOSCOPY     Social History   Occupational History  . Not on file  Tobacco Use  . Smoking status: Every Day    Current packs/day: 0.00    Average packs/day: 1.8 packs/day for 36.0 years (63.0 ttl pk-yrs)    Types: Cigarettes    Start date: 02/16/1968    Last attempt to quit: 02/16/2004    Years since quitting: 20.5  . Smokeless tobacco: Never  Vaping Use  . Vaping status: Never Used  Substance and Sexual Activity  . Alcohol use: No  . Drug use: No  . Sexual activity: Not on file

## 2024-08-30 ENCOUNTER — Other Ambulatory Visit: Payer: Self-pay | Admitting: Family Medicine

## 2024-08-30 DIAGNOSIS — Z1231 Encounter for screening mammogram for malignant neoplasm of breast: Secondary | ICD-10-CM

## 2024-09-01 ENCOUNTER — Ambulatory Visit
Admission: RE | Admit: 2024-09-01 | Discharge: 2024-09-01 | Disposition: A | Discharge status: Home / Self Care | Source: Ambulatory Visit | Attending: Family Medicine | Admitting: Family Medicine

## 2024-09-01 DIAGNOSIS — Z1231 Encounter for screening mammogram for malignant neoplasm of breast: Secondary | ICD-10-CM | POA: Diagnosis not present

## 2024-09-07 ENCOUNTER — Other Ambulatory Visit: Payer: Self-pay | Admitting: Family Medicine

## 2024-09-07 DIAGNOSIS — R928 Other abnormal and inconclusive findings on diagnostic imaging of breast: Secondary | ICD-10-CM

## 2024-09-18 ENCOUNTER — Ambulatory Visit
Admission: RE | Admit: 2024-09-18 | Discharge: 2024-09-18 | Disposition: A | Source: Ambulatory Visit | Attending: Family Medicine | Admitting: Family Medicine

## 2024-09-18 ENCOUNTER — Ambulatory Visit

## 2024-09-18 DIAGNOSIS — R928 Other abnormal and inconclusive findings on diagnostic imaging of breast: Secondary | ICD-10-CM | POA: Diagnosis not present

## 2024-11-09 DIAGNOSIS — E785 Hyperlipidemia, unspecified: Secondary | ICD-10-CM | POA: Diagnosis not present
